# Patient Record
Sex: Male | Born: 1980 | Hispanic: Yes | Marital: Married | Smoking: Never smoker
Health system: Southern US, Community
[De-identification: ages and names within clinical notes are randomized; demographics above are authoritative.]

## PROBLEM LIST (undated history)

## (undated) ENCOUNTER — Emergency Department (HOSPITAL_COMMUNITY): Admission: EM | Payer: 59

## (undated) DIAGNOSIS — I10 Essential (primary) hypertension: Secondary | ICD-10-CM

## (undated) HISTORY — PX: EYE SURGERY: SHX253

---

## 2015-05-23 ENCOUNTER — Emergency Department (INDEPENDENT_AMBULATORY_CARE_PROVIDER_SITE_OTHER)
Admission: EM | Admit: 2015-05-23 | Discharge: 2015-05-23 | Disposition: A | Discharge status: Home / Self Care | Payer: Self-pay | Source: Home / Self Care | Attending: Family Medicine | Admitting: Family Medicine

## 2015-05-23 ENCOUNTER — Encounter (HOSPITAL_COMMUNITY): Payer: Self-pay | Admitting: *Deleted

## 2015-05-23 DIAGNOSIS — A0811 Acute gastroenteropathy due to Norwalk agent: Secondary | ICD-10-CM

## 2015-05-23 MED ORDER — ALIGN 4 MG PO CAPS: 1.0000 | ORAL_CAPSULE | Freq: Two times a day (BID) | ORAL | Status: DC

## 2015-05-23 NOTE — ED Notes (Signed)
Pt    Reports     Vomited     X  2  Days        With  abd  Cramping    With  Symptoms   X  3  Days          family  Members  Have  Similar  Symptoms        Pt  Reports  He  Has  Been  Drinking a  Lot  Of  Fluids  And  Trying to  Rest

## 2015-05-23 NOTE — Discharge Instructions (Signed)
Clear liquid , bland diet tonight as tolerated, advance on tues as improved, use medicine as needed, activia yogurt  return or see your doctor if any problems.

## 2015-05-23 NOTE — ED Provider Notes (Addendum)
CSN: [id number]     Arrival date & time 05/23/15  1319 History   First MD Initiated Contact with Patient 05/23/15 1603     Chief Complaint  Patient presents with  . Nausea   (Consider location/radiation/quality/duration/timing/severity/associated sxs/prior Treatment) Patient is a 35 y.o. male presenting with vomiting. The history is provided by the patient.  Emesis Severity:  Mild Duration:  4 days Quality:  Stomach contents Progression:  Unchanged Chronicity:  New Recent urination:  Normal Context comment:  All family memebers with same.have resolved. Relieved by:  None tried Worsened by:  Nothing tried Ineffective treatments:  None tried Associated symptoms: diarrhea   Associated symptoms: no abdominal pain, no cough and no fever   Risk factors: sick contacts     History reviewed. No pertinent past medical history. History reviewed. No pertinent past surgical history. History reviewed. No pertinent family history. Social History  Substance Use Topics  . Smoking status: None  . Smokeless tobacco: None  . Alcohol Use: No    Review of Systems  Constitutional: Negative.  Negative for fever.  HENT: Negative.   Respiratory: Negative.   Cardiovascular: Negative.   Gastrointestinal: Positive for nausea, vomiting and diarrhea. Negative for abdominal pain and blood in stool.  Genitourinary: Negative.   Musculoskeletal: Negative.   Skin: Negative.   All other systems reviewed and are negative.   Allergies  Review of patient's allergies indicates no known allergies.  Home Medications   Prior to Admission medications   Medication Sig Start Date End Date Taking? Authorizing Provider  Probiotic Product (ALIGN) 4 MG CAPS Take 1 capsule (4 mg total) by mouth 2 (two) times daily at 10 AM and 5 PM. 05/23/15   [name] Furious Chiarelli, MD   Meds Ordered and Administered this Visit  Medications - No data to display  BP 156/93 mmHg  Pulse 85  Temp(Src) 98.2 F (36.8 C) (Oral)  Resp 18   SpO2 97% No data found.   Physical Exam  Constitutional: He is oriented to person, place, and time. He appears well-developed and well-nourished. No distress.  HENT:  Right Ear: External ear normal.  Mouth/Throat: Oropharynx is clear and moist.  Neck: Normal range of motion. Neck supple.  Abdominal: Soft. Bowel sounds are normal. He exhibits no distension and no mass. There is no tenderness. There is no rebound.  Lymphadenopathy:    He has no cervical adenopathy.  Neurological: He is alert and oriented to person, place, and time.  Skin: Skin is warm and dry.  Nursing note and vitals reviewed.   ED Course  Procedures (including critical care time)  Labs Review Labs Reviewed - No data to display  Imaging Review No results found.   Visual Acuity Review  Right Eye Distance:   Left Eye Distance:   Bilateral Distance:    Right Eye Near:   Left Eye Near:    Bilateral Near:         MDM   1. Gastroenteritis due to norovirus        [name] Kreig Parson, MD 05/23/15 1627  [name] Merl Guardino, MD 05/23/15 [phone]

## 2015-09-27 ENCOUNTER — Encounter (HOSPITAL_COMMUNITY): Payer: Self-pay | Admitting: Family Medicine

## 2015-09-27 ENCOUNTER — Ambulatory Visit (HOSPITAL_COMMUNITY)
Admission: EM | Admit: 2015-09-27 | Discharge: 2015-09-27 | Disposition: A | Discharge status: Home / Self Care | Payer: Self-pay | Attending: Family Medicine | Admitting: Family Medicine

## 2015-09-27 DIAGNOSIS — T148 Other injury of unspecified body region: Secondary | ICD-10-CM

## 2015-09-27 DIAGNOSIS — S338XXA Sprain of other parts of lumbar spine and pelvis, initial encounter: Secondary | ICD-10-CM

## 2015-09-27 DIAGNOSIS — M545 Low back pain, unspecified: Secondary | ICD-10-CM

## 2015-09-27 DIAGNOSIS — T148XXA Other injury of unspecified body region, initial encounter: Secondary | ICD-10-CM

## 2015-09-27 DIAGNOSIS — S39012A Strain of muscle, fascia and tendon of lower back, initial encounter: Secondary | ICD-10-CM

## 2015-09-27 MED ORDER — KETOROLAC TROMETHAMINE 60 MG/2ML IM SOLN
INTRAMUSCULAR | Status: AC
  Filled 2015-09-27: qty 2

## 2015-09-27 MED ORDER — KETOROLAC TROMETHAMINE 60 MG/2ML IM SOLN: 60.0000 mg | Freq: Once | INTRAMUSCULAR | Status: DC

## 2015-09-27 MED ORDER — METHOCARBAMOL 500 MG PO TABS: 500.0000 mg | ORAL_TABLET | Freq: Two times a day (BID) | ORAL | Status: DC

## 2015-09-27 MED ORDER — METHYLPREDNISOLONE 4 MG PO TBPK: ORAL_TABLET | ORAL | Status: DC

## 2015-09-27 MED ORDER — TRAMADOL HCL 50 MG PO TABS: 50.0000 mg | ORAL_TABLET | Freq: Four times a day (QID) | ORAL | Status: DC | PRN

## 2015-09-27 NOTE — ED Provider Notes (Signed)
CSN: [id number]     Arrival date & time 09/27/15  1343 History   First MD Initiated Contact with Patient 09/27/15 1424     Chief Complaint  Patient presents with  . 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 Over the past 3 days, significantly worse as he had to increase his work. Pain is located across the mid and lower lumbar on her musculature. He states there is no one single event that caused the pain. He denies blunt trauma, falls or other injury. About 4 days ago he was mowing the yard and lifting the cuttings from the more and carrying them around to dump. The following day is when the pain struck causing the have decreased mobility and greater pain with movement, bending, pulling and other activities. He is having trouble sleeping with the pain. He is taking 600-800 mg of ibuprofen with partial transient relief.   History reviewed. No pertinent past medical history. History reviewed. No pertinent past surgical history. History reviewed. No pertinent family history. Social History  Substance Use Topics  . Smoking status: Never Smoker   . Smokeless tobacco: None  . Alcohol Use: No    Review of Systems  Constitutional: Positive for activity change. Negative for fever and fatigue.  HENT: Negative.   Respiratory: Negative.   Genitourinary: Negative.   Musculoskeletal: Positive for myalgias and back pain. Negative for joint swelling and neck pain.  Skin: Negative.   Neurological: Negative.  Negative for syncope, facial asymmetry, weakness and numbness.  All other systems reviewed and are negative.   Allergies  Review of patient's allergies indicates no known allergies.  Home Medications   Prior to Admission  medications   Medication Sig Start Date End Date Taking? Authorizing Provider  methocarbamol (ROBAXIN) 500 MG tablet Take 1 tablet (500 mg total) by mouth 2 (two) times daily. May cause drowsiness 09/27/15   Hayden Rasmussen, NP  methylPREDNISolone (MEDROL DOSEPAK) 4 MG TBPK tablet follow package directions 09/27/15   Hayden Rasmussen, NP  Probiotic Product (ALIGN) 4 MG CAPS Take 1 capsule (4 mg total) by mouth 2 (two) times daily at 10 AM and 5 PM. 05/23/15   [name], MD  traMADol (ULTRAM) 50 MG tablet Take 1 tablet (50 mg total) by mouth every 6 (six) hours as needed. 09/27/15   Hayden Rasmussen, NP   Meds Ordered and Administered this Visit   Medications  ketorolac (TORADOL) injection 60 mg (not administered)    BP 133/89 mmHg  Pulse 76  Temp(Src) 98.6 F (37 C)  Resp 18  SpO2 99% No data found.   Physical Exam  Constitutional: He is oriented to person, place, and time. He appears well-developed and well-nourished. No distress.  Eyes: EOM are normal.  Neck: Normal range of motion. Neck supple.  Cardiovascular: Normal rate.   Pulmonary/Chest: Effort normal.  Musculoskeletal:  Tenderness across the paralumbar musculature. Mild tenderness over the mid lumbar spine. No palpable deformity, step-off deformity, swelling or discoloration. Palpation reveals a smooth and level on over the spinous process. There are areas over the right and left paraspinous muscles with a knotty texture likely representing muscle spasms. Distal neurovascular motor sensory is intact.  Neurological: He is alert and oriented to person, place, and time. No cranial nerve deficit. He exhibits normal muscle tone.  Skin: Skin is warm and dry.  Psychiatric: He has a normal mood and affect.  Nursing note and vitals reviewed.   ED Course  Procedures (including critical care time)  Labs Review Labs Reviewed - No data to display  Imaging Review No results found.   Visual Acuity Review  Right Eye Distance:   Left Eye  Distance:   Bilateral Distance:    Right Eye Near:   Left Eye Near:    Bilateral Near:         MDM   1. Bilateral low back pain without sciatica   2. Muscle strain   3. Lumbosacral strain, initial encounter    Continue the heat followed by stretches. Minimize as much as possible lifting, bending, pulling and other measures that place stress on the lower back. Meds ordered this encounter  Medications  . ketorolac (TORADOL) injection 60 mg    Sig:   . methylPREDNISolone (MEDROL DOSEPAK) 4 MG TBPK tablet    Sig: follow package directions    Dispense:  21 tablet    Refill:  0    Order Specific Question:  Supervising Provider    Answer:  Linna HoffKINDL, JAMES D [phone][[id number]]  . traMADol (ULTRAM) 50 MG tablet    Sig: Take 1 tablet (50 mg total) by mouth every 6 (six) hours as needed.    Dispense:  15 tablet    Refill:  0    Order Specific Question:  Supervising Provider    Answer:  Linna HoffKINDL, JAMES D [phone][[id number]]  . methocarbamol (ROBAXIN) 500 MG tablet    Sig: Take 1 tablet (500 mg total) by mouth 2 (two) times daily. May cause drowsiness    Dispense:  20 tablet    Refill:  0    Order Specific Question:  Supervising Provider    Answer:  Linna HoffKINDL, JAMES D [[id number]]       Hayden Rasmussenavid Holdyn Poyser, NP 09/27/15 1449

## 2015-09-27 NOTE — Discharge Instructions (Signed)
Back Pain, Adult Continue the heat followed by stretches. Minimize as much as possible lifting, bending, pulling and other measures that place stress on the lower back. Back pain is very common. The pain often gets better over time. The cause of back pain is usually not dangerous. Most people can learn to manage their back pain on their own.  HOME CARE  Watch your back pain for any changes. The following actions may help to lessen any pain you are feeling:  Stay active. Start with short walks on flat ground if you can. Try to walk farther each day.  Exercise regularly as told by your doctor. Exercise helps your back heal faster. It also helps avoid future injury by keeping your muscles strong and flexible.  Do not sit, drive, or stand in one place for more than 30 minutes.  Do not stay in bed. Resting more than 1-2 days can slow down your recovery.  Be careful when you bend or lift an object. Use good form when lifting:  Bend at your knees.  Keep the object close to your body.  Do not twist.  Sleep on a firm mattress. Lie on your side, and bend your knees. If you lie on your back, put a pillow under your knees.  Take medicines only as told by your doctor.  Put ice on the injured area.  Put ice in a plastic bag.  Place a towel between your skin and the bag.  Leave the ice on for 20 minutes, 2-3 times a day for the first 2-3 days. After that, you can switch between ice and heat packs.  Avoid feeling anxious or stressed. Find good ways to deal with stress, such as exercise.  Maintain a healthy weight. Extra weight puts stress on your back. GET HELP IF:   You have pain that does not go away with rest or medicine.  You have worsening pain that goes down into your legs or buttocks.  You have pain that does not get better in one week.  You have pain at night.  You lose weight.  You have a fever or chills. GET HELP RIGHT AWAY IF:   You cannot control when you poop (bowel  movement) or pee (urinate).  Your arms or legs feel weak.  Your arms or legs lose feeling (numbness).  You feel sick to your stomach (nauseous) or throw up (vomit).  You have belly (abdominal) pain.  You feel like you may pass out (faint).   This information is not intended to replace advice given to you by your health care provider. Make sure you discuss any questions you have with your health care provider.   Document Released: 08/22/2007 Document Revised: 03/26/2014 Document Reviewed: 07/07/2013 Elsevier Interactive Patient Education 2016 Elsevier Inc.  [employer] Therapy Heat therapy can help ease sore, stiff, injured, and tight muscles and joints. Heat relaxes your muscles, which may help ease your pain.  RISKS AND COMPLICATIONS If you have any of the following conditions, do not use heat therapy unless your health care provider has approved:  Poor circulation.  Healing wounds or scarred skin in the area being treated.  Diabetes, heart disease, or high blood pressure.  Not being able to feel (numbness) the area being treated.  Unusual swelling of the area being treated.  Active infections.  Blood clots.  Cancer.  Inability to communicate pain. This may include young children and people who have problems with their brain function (dementia).  Pregnancy. Heat therapy should only  be used on old, pre-existing, or long-lasting (chronic) injuries. Do not use heat therapy on new injuries unless directed by your health care provider. HOW TO USE HEAT THERAPY There are several different kinds of heat therapy, including:  Moist heat pack.  Warm water bath.  Hot water bottle.  Electric heating pad.  Heated gel pack.  Heated wrap.  Electric heating pad. Use the heat therapy method suggested by your health care provider. Follow your health care provider's instructions on when and how to use heat therapy. GENERAL HEAT THERAPY RECOMMENDATIONS  Do not sleep while using  heat therapy. Only use heat therapy while you are awake.  Your skin may turn pink while using heat therapy. Do not use heat therapy if your skin turns red.  Do not use heat therapy if you have new pain.  High heat or long exposure to heat can cause burns. Be careful when using heat therapy to avoid burning your skin.  Do not use heat therapy on areas of your skin that are already irritated, such as with a rash or sunburn. SEEK MEDICAL CARE IF:  You have blisters, redness, swelling, or numbness.  You have new pain.  Your pain is worse. MAKE SURE YOU:  Understand these instructions.  Will watch your condition.  Will get help right away if you are not doing well or get worse.   This information is not intended to replace advice given to you by your health care provider. Make sure you discuss any questions you have with your health care provider.   Document Released: 05/28/2011 Document Revised: 03/26/2014 Document Reviewed: 04/28/2013 Elsevier Interactive Patient Education [employer] Elsevier Inc.

## 2015-09-27 NOTE — ED Notes (Signed)
Pt here for lower back pain more on the right side. sts just over use. Denies any specific injury.

## 2015-10-06 ENCOUNTER — Encounter (HOSPITAL_COMMUNITY): Payer: Self-pay | Admitting: Emergency Medicine

## 2015-10-06 ENCOUNTER — Ambulatory Visit (HOSPITAL_COMMUNITY)
Admission: EM | Admit: 2015-10-06 | Discharge: 2015-10-06 | Disposition: A | Discharge status: Home / Self Care | Payer: Self-pay | Attending: Emergency Medicine | Admitting: Emergency Medicine

## 2015-10-06 ENCOUNTER — Ambulatory Visit (INDEPENDENT_AMBULATORY_CARE_PROVIDER_SITE_OTHER): Payer: Self-pay

## 2015-10-06 DIAGNOSIS — M79672 Pain in left foot: Secondary | ICD-10-CM

## 2015-10-06 DIAGNOSIS — M7742 Metatarsalgia, left foot: Secondary | ICD-10-CM

## 2015-10-06 NOTE — ED Provider Notes (Signed)
CSN: [id number]     Arrival date & time 10/06/15  1416 History   First MD Initiated Contact with Patient 10/06/15 1503     Chief Complaint  Patient presents with  . 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 He denies any known injury or any specific time that the pain started it was gradual in onset. Other symptoms include: Resolving back pain Pain score: 7 at its worst FAMILY HISTORY: Hypertension     History reviewed. No pertinent past medical history. History reviewed. No pertinent past surgical history. History reviewed. No pertinent family history. Social History  Substance Use Topics  . Smoking status: Never Smoker   . Smokeless tobacco: None  . Alcohol Use: Yes     Comment: maybe twice a year    Review of Systems  Denies: HEADACHE, NAUSEA, ABDOMINAL PAIN, CHEST PAIN, CONGESTION, DYSURIA, SHORTNESS OF BREATH  Allergies  Review of patient's allergies indicates no known allergies.  Home Medications   Prior to Admission medications   Medication Sig Start Date End Date Taking? Authorizing Provider  ibuprofen (ADVIL,MOTRIN) 200 MG tablet Take 400 mg by mouth every 6 (six) hours as needed for moderate pain.   Yes Historical Provider, MD  methocarbamol (ROBAXIN) 500 MG tablet Take 1 tablet (500 mg total) by mouth 2 (two) times daily. May cause drowsiness 09/27/15   Hayden Rasmussen, NP  methylPREDNISolone (MEDROL DOSEPAK) 4 MG TBPK tablet follow package directions 09/27/15   Hayden Rasmussen, NP  Probiotic Product (ALIGN) 4 MG CAPS Take 1 capsule (4 mg total) by mouth 2 (two) times daily at 10 AM and 5 PM. 05/23/15   [name], MD   traMADol (ULTRAM) 50 MG tablet Take 1 tablet (50 mg total) by mouth every 6 (six) hours as needed. 09/27/15   Hayden Rasmussen, NP   Meds Ordered and Administered this Visit  Medications - No data to display  BP 134/61 mmHg  Pulse 74  Temp(Src) 98.8 F (37.1 C) (Oral)  Resp 16  SpO2 99% No data found.   Physical Exam NURSES NOTES AND VITAL SIGNS REVIEWED. CONSTITUTIONAL: Well developed, well nourished, no acute distress HEENT: normocephalic, atraumatic EYES: Conjunctiva normal NECK:normal ROM, supple, no adenopathy PULMONARY:No respiratory distress, normal effort ABDOMINAL: Soft, ND, NT BS+, No CVAT MUSCULOSKELETAL: Normal ROM of all extremities, left foot is tender along the lateral aspect of the fifth metatarsal. There is no visible or palpable deformity. Sensorimotor function are intact. Patient does weight-bear with slight limp SKIN: warm and dry without rash PSYCHIATRIC: Mood and affect, behavior are normal  ED Course  Procedures (including critical care time)  Labs Review Labs Reviewed - No data to display  Imaging Review Dg Foot Complete Left  10/06/2015  CLINICAL DATA:  Progressive pain for 3 days EXAM: LEFT FOOT - COMPLETE 3+ VIEW COMPARISON:  None. FINDINGS: Frontal, oblique, and lateral views were obtained. There is no fracture or dislocation. The joint spaces appear normal. No erosive change. IMPRESSION: No fracture or dislocation.  No apparent arthropathy. Electronically Signed   By: Bretta Bang III M.D.   On: 10/06/2015 15:44     Visual Acuity Review  Right Eye Distance:  Left Eye Distance:   Bilateral Distance:    Right Eye Near:   Left Eye Near:    Bilateral Near:      We'll try a cam walker to see if this improves his symptoms but unsure that he can wear this to work.  Return to work note provided. MDM   1. Foot pain, left     Patient is reassured that there are no issues that require transfer to higher level of care at this time or  additional tests. Patient is advised to continue home symptomatic treatment. Patient is advised that if there are new or worsening symptoms to attend the emergency department, contact primary care provider, or return to UC. Instructions of care provided discharged home in stable condition.    THIS NOTE WAS GENERATED USING A VOICE RECOGNITION SOFTWARE PROGRAM. ALL REASONABLE EFFORTS  WERE MADE TO PROOFREAD THIS DOCUMENT FOR ACCURACY.  I have verbally reviewed the discharge instructions with the patient. A printed AVS was given to the patient.  All questions were answered prior to discharge.      Tharon AquasFrank C Onedia Vargus, [place] 10/06/15 [phone]

## 2015-10-06 NOTE — Discharge Instructions (Signed)
Musculoskeletal Pain Musculoskeletal pain is muscle and boney aches and pains. These pains can occur in any part of the body. Your caregiver may treat you without knowing the cause of the pain. They may treat you if blood or urine tests, X-rays, and other tests were normal.  CAUSES There is often not a definite cause or reason for these pains. These pains may be caused by a type of germ (virus). The discomfort may also come from overuse. Overuse includes working out too hard when your body is not fit. Boney aches also come from weather changes. Bone is sensitive to atmospheric pressure changes. HOME CARE INSTRUCTIONS   Ask when your test results will be ready. Make sure you get your test results.  Only take over-the-counter or prescription medicines for pain, discomfort, or fever as directed by your caregiver. If you were given medications for your condition, do not drive, operate machinery or power tools, or sign legal documents for 24 hours. Do not drink alcohol. Do not take sleeping pills or other medications that may interfere with treatment.  Continue all activities unless the activities cause more pain. When the pain lessens, slowly resume normal activities. Gradually increase the intensity and duration of the activities or exercise.  During periods of severe pain, bed rest may be helpful. Lay or sit in any position that is comfortable.  Putting ice on the injured area.  Put ice in a bag.  Place a towel between your skin and the bag.  Leave the ice on for 15 to 20 minutes, 3 to 4 times a day.  Follow up with your caregiver for continued problems and no reason can be found for the pain. If the pain becomes worse or does not go away, it may be necessary to repeat tests or do additional testing. Your caregiver may need to look further for a possible cause. SEEK IMMEDIATE MEDICAL CARE IF:  You have pain that is getting worse and is not relieved by medications.  You develop chest pain  that is associated with shortness or breath, sweating, feeling sick to your stomach (nauseous), or throw up (vomit).  Your pain becomes localized to the abdomen.  You develop any new symptoms that seem different or that concern you. MAKE SURE YOU:   Understand these instructions.  Will watch your condition.  Will get help right away if you are not doing well or get worse.   This information is not intended to replace advice given to you by your health care provider. Make sure you discuss any questions you have with your health care provider.   Document Released: 03/05/2005 Document Revised: 05/28/2011 Document Reviewed: 11/07/2012 Elsevier Interactive Patient Education 2016 [employer] therapy can help ease sore, stiff, injured, and tight muscles and joints. Heat relaxes your muscles, which may help ease your pain. Heat therapy should only be used on old, pre-existing, or long-lasting (chronic) injuries. Do not use heat therapy unless told by your doctor. HOW TO USE HEAT THERAPY There are several different kinds of heat therapy, including:  Moist heat pack.  Warm water bath.  Hot water bottle.  Electric heating pad.  Heated gel pack.  Heated wrap.  Electric heating pad. GENERAL HEAT THERAPY RECOMMENDATIONS   Do not sleep while using heat therapy. Only use heat therapy while you are awake.  Your skin may turn pink while using heat therapy. Do not use heat therapy if your skin turns red.  Do not use heat therapy if  you have new pain.  High heat or long exposure to heat can cause burns. Be careful when using heat therapy to avoid burning your skin.  Do not use heat therapy on areas of your skin that are already irritated, such as with a rash or sunburn. GET HELP IF:   You have blisters, redness, swelling (puffiness), or numbness.  You have new pain.  Your pain is worse. MAKE SURE YOU:  Understand these instructions.  Will watch your  condition.  Will get help right away if you are not doing well or get worse.   This information is not intended to replace advice given to you by your health care provider. Make sure you discuss any questions you have with your health care provider.   Document Released: 05/28/2011 Document Revised: 03/26/2014 Document Reviewed: 04/28/2013 Elsevier Interactive Patient Education [employer] Elsevier Inc[employer]

## 2015-10-06 NOTE — ED Notes (Signed)
Pt is suffering from lateral left foot pain.  He states it started while finishing up a day at work.  He had two days off, but the pain has not subsided.  He reports a 1-2/10 without ambulation and 4-7/10 with ambulation.  Pt denies any specific injury to the foot.

## 2018-05-25 ENCOUNTER — Other Ambulatory Visit: Payer: Self-pay

## 2018-05-25 ENCOUNTER — Emergency Department (HOSPITAL_COMMUNITY)
Admission: EM | Admit: 2018-05-25 | Discharge: 2018-05-25 | Disposition: A | Discharge status: Home / Self Care | Payer: Self-pay | Attending: Emergency Medicine | Admitting: Emergency Medicine

## 2018-05-25 ENCOUNTER — Encounter (HOSPITAL_COMMUNITY): Payer: Self-pay | Admitting: Emergency Medicine

## 2018-05-25 ENCOUNTER — Emergency Department (HOSPITAL_COMMUNITY): Payer: Self-pay

## 2018-05-25 DIAGNOSIS — J101 Influenza due to other identified influenza virus with other respiratory manifestations: Secondary | ICD-10-CM | POA: Insufficient documentation

## 2018-05-25 DIAGNOSIS — Z79899 Other long term (current) drug therapy: Secondary | ICD-10-CM | POA: Insufficient documentation

## 2018-05-25 LAB — GROUP A STREP BY PCR: GROUP A STREP BY PCR: NOT DETECTED

## 2018-05-25 LAB — INFLUENZA PANEL BY PCR (TYPE A & B)
INFLAPCR: POSITIVE — AB
Influenza B By PCR: NEGATIVE

## 2018-05-25 MED ORDER — ACETAMINOPHEN 325 MG PO TABS
650.0000 mg | ORAL_TABLET | Freq: Once | ORAL | Status: AC
  Administered 2018-05-25: 650 mg via ORAL
  Filled 2018-05-25: qty 2

## 2018-05-25 NOTE — ED Notes (Signed)
Pt. To x-ray .

## 2018-05-25 NOTE — Discharge Instructions (Signed)
Evaluated today for symptoms.  Your flu test was positive.  Continue to take Tylenol and ibuprofen for fever and body aches and pains.  Try to continue drinking fluids daily not become dehydrated.  Follow up with PCP if you continue to have symptoms beyond 5 days.  I would suggest calling the NICU to determine visiting precautions as your flu positive and premature infants are more susceptible to complications from the flu.  Return to the ED for any worsening symptoms.

## 2018-05-25 NOTE — ED Provider Notes (Signed)
Care assumed at shift change from Ward, PA-C.  See note for detailed HPI.  In summation 38 year old male, who presents for evaluation of flu-like symptoms. Onset 3 days ago. Temp max at home 102.6 yesterday evening.  Has been taking Tylenol and ibuprofen for symptoms.  Patient has Hunt 61-week-old premature infant currently in NICU.  Patient states he would like to know if he is Flu+ as he may have different visiting guidelines if he is positive.  Care transfer patient with negative strep and negative chest x-ray.  Influenza testing pending.  Influenza Hunt positive. Patient understands that he is outside treatment window for Tamiflu. Vitals are stable, low-grade fever.  No signs of dehydration, tolerating PO's.  Lungs are clear bilateral without wheeze, rhonchi or rales.  No tachypnea, tachycardia or hypoxia. Patient will be discharged with instructions to orally hydrate, rest, and use over-the-counter medications such as anti-inflammatories ibuprofen and Aleve for muscle aches and Tylenol for fever.  Patient hemodynamically stable and appropriate for discharge at this time.  Discussed return precautions with patient.  Patient voiced understanding and is agreeable for follow up.   1. Influenza Hunt   Antonio Madariaga A, PA-C 05/25/18 1641    Linwood Dibbles, MD 05/27/18 [phone]

## 2018-05-25 NOTE — ED Provider Notes (Signed)
[facility] EMERGENCY DEPARTMENT Provider Note   CSN: [id number] Arrival date & time: 05/25/18  1302    History   Chief Complaint Chief Complaint  Patient presents with  . URI    HPI Antonio Hunt is a 38 y.o. male.     The history is provided by the patient and medical records. No language interpreter was used.  URI  Presenting symptoms: congestion, cough, ear pain, fever and sore throat   Associated symptoms: myalgias   Associated symptoms: no headaches and no neck pain    Antonio Hunt is a 38 y.o. male  with no known PMH who presents to the Emergency Department complaining of cough, congestion x 3 days.  He reports temperature at home of 102.6 last night.  He took ibuprofen and Tylenol, alternating between the 2.  This morning, he took DayQuil instead of Tylenol.  He also has been using cough drops.  Associated with chills, sweats, rhinorrhea, fatigue and mild generalized body aches.  Denies any chest pain or trouble breathing.  No abdominal pain, nausea or vomiting.  Patient reports that he currently has a newborn infant that was born premature in the NICU. He would like to know if he has the flu, stating that the NICU has strict visiting guidelines and timeframes if you have tested positive.   History reviewed. No pertinent past medical history.  There are no active problems to display for this patient.   Past Surgical History:  Procedure Laterality Date  . EYE SURGERY          Home Medications    Prior to Admission medications   Medication Sig Start Date End Date Taking? Authorizing Provider  ibuprofen (ADVIL,MOTRIN) 200 MG tablet Take 400 mg by mouth every 6 (six) hours as needed for moderate pain.    [provider]  methocarbamol (ROBAXIN) 500 MG tablet Take 1 tablet (500 mg total) by mouth 2 (two) times daily. May cause drowsiness 09/27/15   Hayden Rasmussen, NP  methylPREDNISolone (MEDROL DOSEPAK) 4 MG TBPK tablet follow package  directions 09/27/15   Hayden Rasmussen, NP  Probiotic Product (ALIGN) 4 MG CAPS Take 1 capsule (4 mg total) by mouth 2 (two) times daily at 10 AM and 5 PM. 05/23/15   Kindl, Quita Skye, MD  traMADol (ULTRAM) 50 MG tablet Take 1 tablet (50 mg total) by mouth every 6 (six) hours as needed. 09/27/15   Hayden Rasmussen, NP    Family History History reviewed. No pertinent family history.  Social History Social History   Tobacco Use  . Smoking status: Never Smoker  . Smokeless tobacco: Never Used  Substance Use Topics  . Alcohol use: Yes    Comment: maybe twice a year  . Drug use: No     Allergies   Patient has no known allergies.   Review of Systems Review of Systems  Constitutional: Positive for chills and fever.  HENT: Positive for congestion, ear pain and sore throat.   Eyes: Negative for discharge.  Respiratory: Positive for cough. Negative for shortness of breath.   Cardiovascular: Negative for chest pain.  Gastrointestinal: Negative for abdominal pain, diarrhea, nausea and vomiting.  Musculoskeletal: Positive for myalgias. Negative for neck pain.  Skin: Negative for rash.  Allergic/Immunologic: Negative for immunocompromised state.  Neurological: Negative for headaches.  All other systems reviewed and are negative.    Physical Exam Updated Vital Signs BP (!) 153/89 (BP Location: Right Arm)   Pulse 100   Temp 100 F (  37.8 C) (Oral)   Resp 18   Ht 5\' 6"  (1.676 m)   Wt 119.3 kg   SpO2 94%   BMI 42.45 kg/m   Physical Exam Vitals signs and nursing note reviewed.  Constitutional:      General: He is not in acute distress.    Appearance: He is well-developed.  HENT:     Head: Normocephalic and atraumatic.     Ears:     Comments: Bilateral TMs normal.  No mastoid tenderness.    Nose: Congestion present.     Mouth/Throat:     Comments: Oropharynx with erythema, no exudates or tonsillar hypertrophy appreciated. Neck:     Musculoskeletal: Neck supple.  Cardiovascular:      Rate and Rhythm: Normal rate and regular rhythm.     Heart sounds: Normal heart sounds. No murmur.  Pulmonary:     Effort: Pulmonary effort is normal. No respiratory distress.     Breath sounds: Normal breath sounds.     Comments: Lungs are clear to ausculation bilaterally. Abdominal:     General: There is no distension.     Palpations: Abdomen is soft.     Tenderness: There is no abdominal tenderness.  Skin:    General: Skin is warm and dry.  Neurological:     Mental Status: He is alert and oriented to person, place, and time.      ED Treatments / Results  Labs (all labs ordered are listed, but only abnormal results are displayed) Labs Reviewed  GROUP A STREP BY PCR  INFLUENZA PANEL BY PCR (TYPE A & B)    EKG None  Radiology Dg Chest 2 View  Result Date: 05/25/2018 CLINICAL DATA:  Upper respiratory infection EXAM: CHEST - 2 VIEW COMPARISON:  None. FINDINGS: Normal heart size and mediastinal contours. No acute infiltrate or edema. No effusion or pneumothorax. No acute osseous findings. IMPRESSION: Negative chest. Electronically Signed   By: Marnee Spring M.D.   On: 05/25/2018 14:51    Procedures Procedures (including critical care time)  Medications Ordered in ED Medications - No data to display   Initial Impression / Assessment and Plan / ED Course  I have reviewed the triage vital signs and the nursing notes.  Pertinent labs & imaging results that were available during my care of the patient were reviewed by me and considered in my medical decision making (see chart for details).       Antonio Hunt is a 38 y.o. male who presents to ED for fever, cough, congestion which has been getting progressively worse over the last 3 days.  Temp of 102.6 at home.  He took antipyretics with temperature of 100 upon ER arrival.  His lungs are clear to auscultation bilaterally.  Chest x-ray without acute disease - no pneumonia. Patient does have a preemie in the NICU and  requesting flu test.  Feel this is reasonable. Flu swab sent and pending at shift change. Strep PCR pending as well. Care assumed by oncoming provider PA Henderly. Case discussed, plan agreed upon. Will follow up on pending strep/flu swabs. He is out of the window for Tamiflu. Anticipate symptomatic home care with close PCP follow up.    Final Clinical Impressions(s) / ED Diagnoses   Final diagnoses:  None    ED Discharge Orders    None       , Chase Picket, PA-C 05/25/18 1545    Linwood Dibbles, MD 05/27/18 [phone]

## 2018-05-25 NOTE — ED Triage Notes (Signed)
The patient's wife has been having URI symptoms and now he has been coughing, sneezing, sweating, runny nose, pain in ears and fatigue.  The cough is productive, dark yellow so he has been taking dayquil, nightquil, cough drops and tylenol and ibuprofen.  He has had a fever of 102.6.  His fever has gone down to 100.  Denies any other symptoms.

## 2019-12-04 ENCOUNTER — Other Ambulatory Visit: Payer: Self-pay

## 2019-12-04 DIAGNOSIS — Z20822 Contact with and (suspected) exposure to covid-19: Secondary | ICD-10-CM

## 2019-12-07 LAB — NOVEL CORONAVIRUS, NAA: SARS-CoV-2, NAA: NOT DETECTED

## 2020-02-10 ENCOUNTER — Other Ambulatory Visit: Payer: Self-pay

## 2020-02-10 DIAGNOSIS — Z20822 Contact with and (suspected) exposure to covid-19: Secondary | ICD-10-CM

## 2020-02-11 LAB — NOVEL CORONAVIRUS, NAA: SARS-CoV-2, NAA: NOT DETECTED

## 2020-02-11 LAB — SARS-COV-2, NAA 2 DAY TAT

## 2020-07-24 IMAGING — DX DG CHEST 2V
2 series · 2 of 2 positions shown · non-contrast
Comparison: None.

CLINICAL DATA: Upper respiratory infection

EXAM:
CHEST - 2 VIEW

[chest pa]
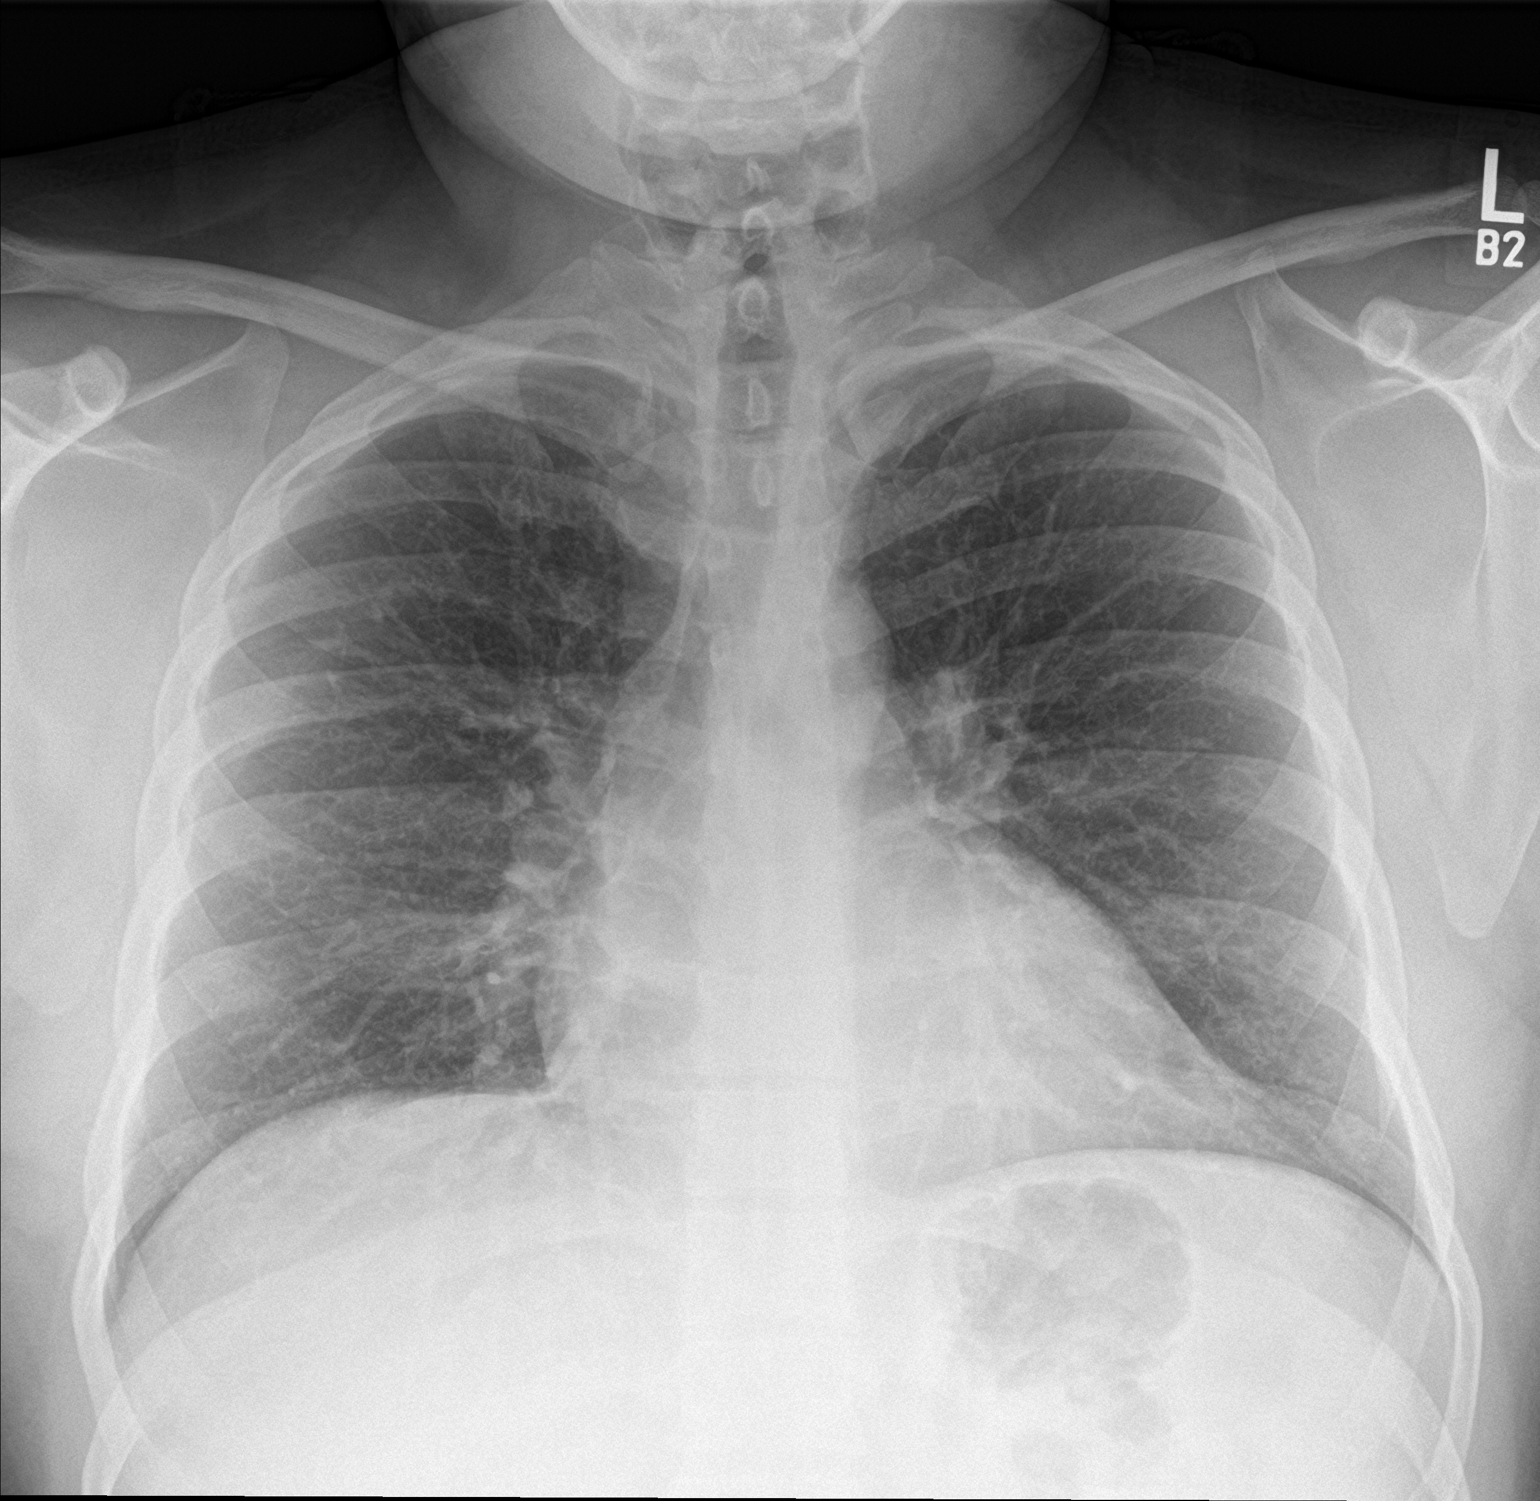

[chest lat]
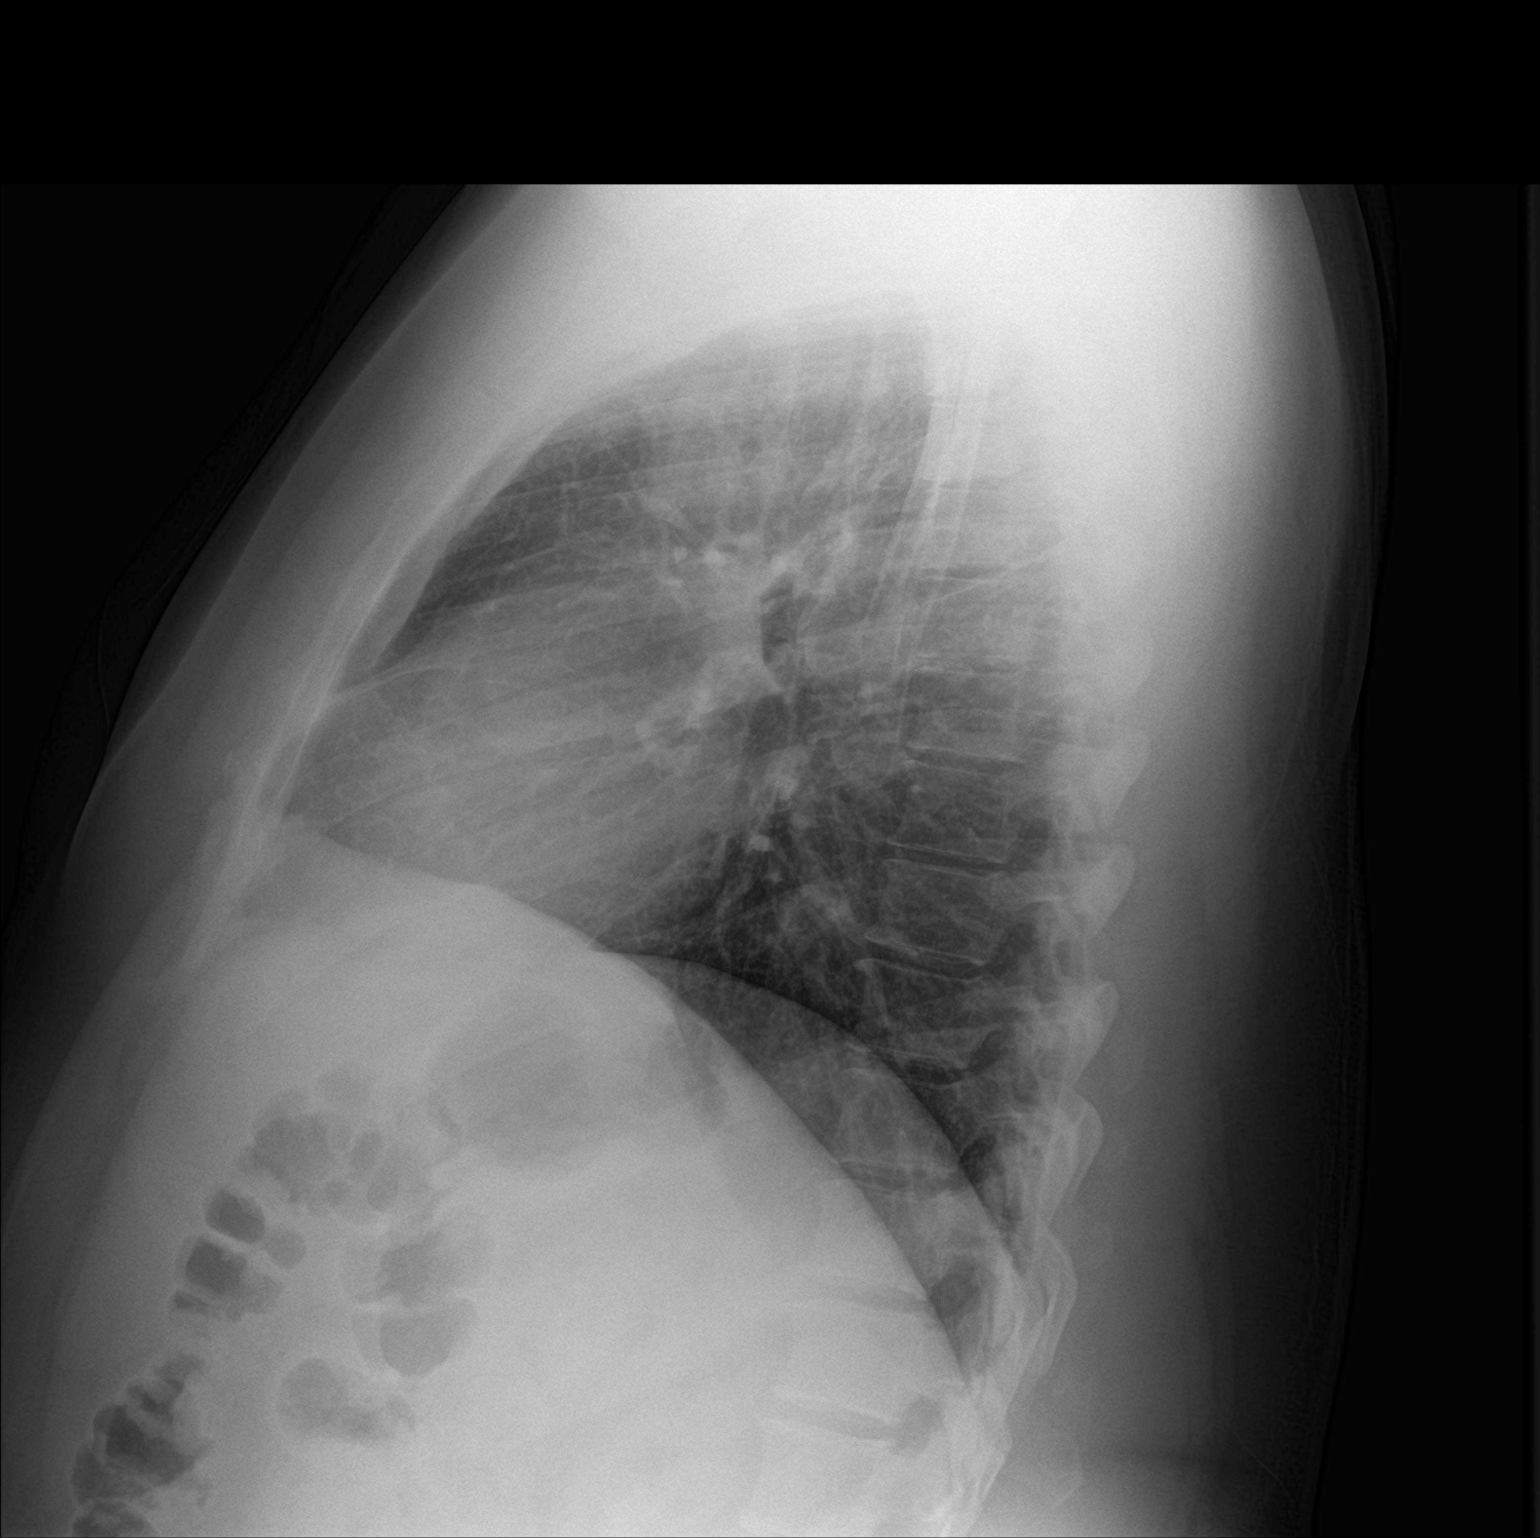

[2 of 2 positions shown; findings below may reference images not displayed]

FINDINGS: Normal heart size and mediastinal contours. No acute infiltrate or
edema. No effusion or pneumothorax. No acute osseous findings.
IMPRESSION: Negative chest.

## 2021-06-26 ENCOUNTER — Emergency Department (HOSPITAL_COMMUNITY)
Admission: EM | Admit: 2021-06-26 | Discharge: 2021-06-26 | Disposition: A | Discharge status: Home / Self Care | Payer: 59 | Attending: Emergency Medicine | Admitting: Emergency Medicine

## 2021-06-26 ENCOUNTER — Encounter (HOSPITAL_COMMUNITY): Payer: Self-pay | Admitting: Emergency Medicine

## 2021-06-26 ENCOUNTER — Emergency Department (HOSPITAL_COMMUNITY): Payer: 59

## 2021-06-26 ENCOUNTER — Other Ambulatory Visit: Payer: Self-pay

## 2021-06-26 DIAGNOSIS — I10 Essential (primary) hypertension: Secondary | ICD-10-CM | POA: Insufficient documentation

## 2021-06-26 DIAGNOSIS — R070 Pain in throat: Secondary | ICD-10-CM | POA: Insufficient documentation

## 2021-06-26 DIAGNOSIS — R0789 Other chest pain: Secondary | ICD-10-CM | POA: Insufficient documentation

## 2021-06-26 HISTORY — DX: Essential (primary) hypertension: I10

## 2021-06-26 LAB — BASIC METABOLIC PANEL
Anion gap: 8 (ref 5–15)
BUN: 9 mg/dL (ref 6–20)
CO2: 25 mmol/L (ref 22–32)
Calcium: 9.2 mg/dL (ref 8.9–10.3)
Chloride: 104 mmol/L (ref 98–111)
Creatinine, Ser: 0.8 mg/dL (ref 0.61–1.24)
GFR, Estimated: >60 mL/min (ref >60)
Glucose, Bld: 131 mg/dL — ABNORMAL HIGH (ref 70–99)
Potassium: 4.1 mmol/L (ref 3.5–5.1)
Sodium: 137 mmol/L (ref 135–145)

## 2021-06-26 LAB — CBC
HCT: 44.6 % (ref 39.0–52.0)
Hemoglobin: 14.7 g/dL (ref 13.0–17.0)
MCH: 30.1 pg (ref 26.0–34.0)
MCHC: 33 g/dL (ref 30.0–36.0)
MCV: 91.4 fL (ref 80.0–100.0)
Platelets: 281 10*3/uL (ref 150–400)
RBC: 4.88 MIL/uL (ref 4.22–5.81)
RDW: 13.5 % (ref 11.5–15.5)
WBC: 10.7 10*3/uL — ABNORMAL HIGH (ref 4.0–10.5)
nRBC: 0 % (ref 0.0–0.2)

## 2021-06-26 LAB — TROPONIN I (HIGH SENSITIVITY)
Troponin I (High Sensitivity): 8 ng/L (ref <18)
Troponin I (High Sensitivity): 4 ng/L (ref <18)

## 2021-06-26 MED ORDER — LIDOCAINE VISCOUS HCL 2 % MT SOLN
15.0000 mL | Freq: Once | OROMUCOSAL | Status: AC
  Administered 2021-06-26: 15 mL via ORAL
  Filled 2021-06-26: qty 15

## 2021-06-26 MED ORDER — ALUM & MAG HYDROXIDE-SIMETH 200-200-20 MG/5ML PO SUSP
30.0000 mL | Freq: Once | ORAL | Status: AC
  Administered 2021-06-26: 30 mL via ORAL
  Filled 2021-06-26: qty 30

## 2021-06-26 NOTE — ED Triage Notes (Signed)
Pt c/o chest pain and hypertension since last night ?

## 2021-06-26 NOTE — ED Provider Notes (Signed)
?[facility] EMERGENCY DEPARTMENT ?Provider Note ? ? ?CSN: [id number] ?Arrival date & time: 06/26/21  0101 ? ?  ? ?History ? ?Chief Complaint  ?Patient presents with  ? Chest Pain  ? ? ?Kenry Daubert is a 41 y.o. male. ? ?The history is provided by the patient. No language interpreter was used.  ?Chest Pain ? ?41 year old morbidly obese male significant history of hypertension, presenting to the ED for evaluation of chest pain.  Patient report he woke up this morning with a discomfort sensation in his mid upper chest towards his neck.  Felt some acidic taste in his mouth, and some burning sensation in his neck.  Symptom has been waxing waning, he improves with eating a banana, and taking some Prilosec.  He did not report of any associated fever chills runny nose sneezing nausea vomiting diarrhea constipation lightheadedness dizziness or diaphoresis.  No significant family history of premature cardiac death.  Patient denies alcohol or tobacco abuse.  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  Does not complain of any leg swelling or calf pain.  Symptoms mild at this time.  Patient report eating salty food last night which felt may contribute to his symptoms ? ?Home Medications ?Prior to Admission medications   ?Medication Sig Start Date End Date Taking? Authorizing Provider  ?ibuprofen (ADVIL,MOTRIN) 200 MG tablet Take 400 mg by mouth every 6 (six) hours as needed for moderate pain.    [provider]  ?methocarbamol (ROBAXIN) 500 MG tablet Take 1 tablet (500 mg total) by mouth 2 (two) times daily. May cause drowsiness 09/27/15   Hayden Rasmussen, NP  ?methylPREDNISolone (MEDROL DOSEPAK) 4 MG TBPK tablet follow package directions 09/27/15   Hayden Rasmussen, NP  ?Probiotic Product (ALIGN) 4 MG CAPS Take 1 capsule (4 mg total) by mouth 2 (two) times daily at 10 AM and 5 PM. 05/23/15   Kindl, Quita Skye, MD   ?traMADol (ULTRAM) 50 MG tablet Take 1 tablet (50 mg total) by mouth every 6 (six) hours as needed. 09/27/15   Hayden Rasmussen, NP  ?   ? ?Allergies    ?Patient has no known allergies.   ? ?Review of Systems   ?Review of Systems  ?Cardiovascular:  Positive for chest pain.  ?All other systems reviewed and are negative. ? ?Physical Exam ?Updated Vital Signs ?BP (!) 165/108 (BP Location: Right Arm)   Pulse 99   Temp 98.7 ?F (37.1 ?C) (Oral)   Resp 18   Ht 5\' 6"  (1.676 m)   Wt (!) 139.7 kg   SpO2 98%   BMI 49.71 kg/m?  ?Physical Exam ?Vitals and nursing note reviewed.  ?Constitutional:   ?   General: He is not in acute distress. ?   Appearance: He is well-developed. He is obese.  ?HENT:  ?   Head: Atraumatic.  ?Eyes:  ?   Conjunctiva/sclera: Conjunctivae normal.  ?Cardiovascular:  ?   Rate and Rhythm: Normal rate and regular rhythm.  ?   Pulses: Normal pulses.  ?   Heart sounds: Normal heart sounds.  ?Pulmonary:  ?   Effort: Pulmonary effort is normal.  ?   Breath sounds: Normal breath sounds. No wheezing, rhonchi or rales.  ?Chest:  ?   Chest wall: No tenderness.  ?Abdominal:  ?   Palpations: Abdomen is soft.  ?   Tenderness: There is no abdominal tenderness.  ?  Musculoskeletal:  ?   Cervical back: Neck supple.  ?   Right lower leg: No edema.  ?   Left lower leg: No edema.  ?Skin: ?   Findings: No rash.  ?Neurological:  ?   Mental Status: He is alert.  ? ? ?ED Results / Procedures / Treatments   ?Labs ?(all labs ordered are listed, but only abnormal results are displayed) ?Labs Reviewed  ?BASIC METABOLIC PANEL - Abnormal; Notable for the following components:  ?    Result Value  ? Glucose, Bld 131 (*)   ? All other components within normal limits  ?CBC - Abnormal; Notable for the following components:  ? WBC 10.7 (*)   ? All other components within normal limits  ?TROPONIN I (HIGH SENSITIVITY)  ?TROPONIN I (HIGH SENSITIVITY)  ? ? ?EKG ?None ? Date: 06/26/2021 ? Rate: 99 ? Rhythm: normal sinus rhythm ? QRS Axis:  normal ? Intervals: normal ? ST/T Wave abnormalities: normal ? Conduction Disutrbances: none ? Narrative Interpretation:  ? Old EKG Reviewed: No significant changes noted ? ? ? ?Radiology ?DG Chest 2 View ? ?Result Date: 06/26/2021 ?CLINICAL DATA:  Chest pain. EXAM: CHEST - 2 VIEW COMPARISON:  May 25, 2018 FINDINGS: The heart size and mediastinal contours are within normal limits. Both lungs are clear. The visualized skeletal structures are unremarkable. IMPRESSION: No active cardiopulmonary disease. Electronically Signed   By: Aram Candelahaddeus  Houston M.D.   On: 06/26/2021 01:34   ? ?Procedures ?Procedures  ? ? ?Medications Ordered in ED ?Medications  ?alum & mag hydroxide-simeth (MAALOX/MYLANTA) 200-200-20 MG/5ML suspension 30 mL (30 mLs Oral Given 06/26/21 0141)  ?  And  ?lidocaine (XYLOCAINE) 2 % viscous mouth solution 15 mL (15 mLs Oral Given 06/26/21 0141)  ? ? ?ED Course/ Medical Decision Making/ A&P ?  ?                        ?Medical Decision Making ?Amount and/or Complexity of Data Reviewed ?Labs: ordered. ?Radiology: ordered. ? ?Risk ?OTC drugs. ?Prescription drug management. ? ? ?BP (!) 165/108 (BP Location: Right Arm)   Pulse 99   Temp 98.7 ?F (37.1 ?C) (Oral)   Resp 18   Ht 5\' 6"  (1.676 m)   Wt (!) 139.7 kg   SpO2 98%   BMI 49.71 kg/m?  ? ?1:43 AM ?This is an obese male with history of hypertension presenting with complaints of chest pain that started early this morning.  Pain is atypical of ACS.  Pain is minimal at this time.  There is some component to suggest this could be acid reflux.  Patient also noted to have elevated blood pressure of 165/108.  He is not on any blood pressure medication.  Heart rate is elevated but patient attributes to "whitecoat syndrome".  He is PERC negative, low suspicion for PE.  GI cocktail given.  Work-up initiated. ? ?3:46 AM ?Labs, EKG, and imaging independently viewed and interpreted by me.  EKG without acute ischemic changes, negative delta troponin, low suspicion  for ACS.  Patient's hear score is too low risk for Mace.  Electrolyte panels are reassuring, normal WBC, normal H&H, chest x-ray without active disease.  Patient report feeling better after receiving GI cocktail.  At this time he is stable to be discharged ? ? ? ? ? ? ? ?Final Clinical Impression(s) / ED Diagnoses ?Final diagnoses:  ?Atypical chest pain  ? ? ?Rx / DC Orders ?ED Discharge Orders   ? ?  None  ? ?  ? ? ?  ?Fayrene Helper, PA-C ?06/26/21 3007 ? ?  ?Nira Conn, MD ?06/26/21 0840 ? ?

## 2021-06-26 NOTE — ED Notes (Signed)
Patient verbalizes understanding of discharge instructions. Opportunity for questioning and answers were provided. Armband removed by staff, pt discharged from ED and ambulated to lobby to return home with friend.  ?

## 2021-06-26 NOTE — ED Notes (Signed)
Patient transported to X-ray 

## 2022-05-15 ENCOUNTER — Other Ambulatory Visit: Payer: Self-pay

## 2022-05-15 ENCOUNTER — Encounter (HOSPITAL_BASED_OUTPATIENT_CLINIC_OR_DEPARTMENT_OTHER): Payer: Self-pay | Admitting: Emergency Medicine

## 2022-05-15 ENCOUNTER — Emergency Department (HOSPITAL_BASED_OUTPATIENT_CLINIC_OR_DEPARTMENT_OTHER): Payer: 59

## 2022-05-15 ENCOUNTER — Emergency Department (HOSPITAL_BASED_OUTPATIENT_CLINIC_OR_DEPARTMENT_OTHER)
Admission: EM | Admit: 2022-05-15 | Discharge: 2022-05-15 | Disposition: A | Discharge status: Home / Self Care | Payer: 59 | Attending: Emergency Medicine | Admitting: Emergency Medicine

## 2022-05-15 ENCOUNTER — Emergency Department (HOSPITAL_COMMUNITY): Payer: 59

## 2022-05-15 DIAGNOSIS — R200 Anesthesia of skin: Secondary | ICD-10-CM

## 2022-05-15 LAB — CBC WITH DIFFERENTIAL/PLATELET
Abs Immature Granulocytes: 0.03 10*3/uL (ref 0.00–0.07)
Basophils Absolute: 0 10*3/uL (ref 0.0–0.1)
Basophils Relative: 0 %
Eosinophils Absolute: 0 10*3/uL (ref 0.0–0.5)
Eosinophils Relative: 0 %
HCT: 41.7 % (ref 39.0–52.0)
Hemoglobin: 14.2 g/dL (ref 13.0–17.0)
Immature Granulocytes: 0 %
Lymphocytes Relative: 14 %
Lymphs Abs: 1.5 10*3/uL (ref 0.7–4.0)
MCH: 30.3 pg (ref 26.0–34.0)
MCHC: 34.1 g/dL (ref 30.0–36.0)
MCV: 88.9 fL (ref 80.0–100.0)
Monocytes Absolute: 0.6 10*3/uL (ref 0.1–1.0)
Monocytes Relative: 5 %
Neutro Abs: 8.8 10*3/uL — ABNORMAL HIGH (ref 1.7–7.7)
Neutrophils Relative %: 81 %
Platelets: 306 10*3/uL (ref 150–400)
RBC: 4.69 MIL/uL (ref 4.22–5.81)
RDW: 13.6 % (ref 11.5–15.5)
WBC: 10.9 10*3/uL — ABNORMAL HIGH (ref 4.0–10.5)
nRBC: 0 % (ref 0.0–0.2)

## 2022-05-15 LAB — BASIC METABOLIC PANEL
Anion gap: 10 (ref 5–15)
BUN: 10 mg/dL (ref 6–20)
CO2: 25 mmol/L (ref 22–32)
Calcium: 9.3 mg/dL (ref 8.9–10.3)
Chloride: 103 mmol/L (ref 98–111)
Creatinine, Ser: 0.69 mg/dL (ref 0.61–1.24)
GFR, Estimated: >60 mL/min (ref >60)
Glucose, Bld: 122 mg/dL — ABNORMAL HIGH (ref 70–99)
Potassium: 4 mmol/L (ref 3.5–5.1)
Sodium: 138 mmol/L (ref 135–145)

## 2022-05-15 MED ORDER — IOHEXOL 350 MG/ML SOLN
100.0000 mL | Freq: Once | INTRAVENOUS | Status: AC | PRN
  Administered 2022-05-15: 100 mL via INTRAVENOUS

## 2022-05-15 NOTE — ED Provider Notes (Signed)
  Physical Exam  BP (!) 162/94   Pulse 96   Temp 99.1 F (37.3 C)   Resp 18   Wt (!) 154.2 kg   SpO2 97%   BMI 54.88 kg/m   Physical Exam  Procedures  Procedures  ED Course / MDM   Clinical Course as of 05/15/22 0851  Tue May 15, 2022  0211 CBC is unremarkable. I personally viewed the images from radiology studies and agree with radiologist interpretation: CT is neg [CS]  0245 BMP is normal. Will discuss with Neurology.  [CS]  [phone] Spoke with Dr. Lorrin Goodell who recommend CTA head and neck and then transfer to [facility] for MRI.  [CS]  0410 I personally viewed the images from radiology studies and agree with radiologist interpretation:  CTA is neg for acute process. Non obstructive carotid disease.  [CS]  [id number] Patient to go via POV to [facility] for MRI. Dr. Christy Gentles aware.  [CS]  [phone] MRI brain reviewed interpreted [facility] abnormality noted and radiologist interpretation concurs [DR]  [id number] CT head without contrast and CT angio head and neck no acute [facility] CT head no emergent findings premature atherosclerosis with low-density plaque causing 50% right ICA origin stenosis noted [DR]    Clinical Course User Index [CS] Truddie Hidden, MD [DR] Pattricia Boss, MD   Medical Decision Making Amount and/or Complexity of Data Reviewed Labs: ordered. Radiology: ordered.  Risk Prescription drug management.   42 yo male sent here from [facility] for MRI for facial numbness.  Patient here for MRI. Results to be reviewed and dispo as appropriate  Imaging reviewed without any evidence of acute abnormality Premature atherosclerosis of the right ICA noted.  Discussed with patient and advised regarding risk factor modifications and need for close follow-up with referral to vascular surgeon     Pattricia Boss, MD 05/15/22 [phone]

## 2022-05-15 NOTE — ED Provider Notes (Signed)
[facility]  Provider Note  CSN: [id number] Arrival date & time: 05/15/22 0015  History Chief Complaint  Patient presents with   Facial Numbness    Antonio Hunt is a 42 y.o. male with no known medical problems reports he woke up around 0800 on 2/26 and noticed some numbness to the left side of his face. He reports the symptoms were not particularly noticeable as he was working during the day but he noticed it was still persistent when he was getting ready for bed prompting his ED evaluation. He has not had any facial droop, slurred speech, arm/leg weakness or numbness. No difficulty walking or dizziness. No fever or ear pain.    Home Medications Prior to Admission medications   Medication Sig Start Date End Date Taking? Authorizing Provider  ibuprofen (ADVIL,MOTRIN) 200 MG tablet Take 400 mg by mouth every 6 (six) hours as needed for moderate pain.    [provider]  methocarbamol (ROBAXIN) 500 MG tablet Take 1 tablet (500 mg total) by mouth 2 (two) times daily. May cause drowsiness 09/27/15   Janne Napoleon, NP  methylPREDNISolone (MEDROL DOSEPAK) 4 MG TBPK tablet follow package directions 09/27/15   Janne Napoleon, NP  Probiotic Product (ALIGN) 4 MG CAPS Take 1 capsule (4 mg total) by mouth 2 (two) times daily at 10 AM and 5 PM. 05/23/15   Kindl, Nelda Severe, MD  traMADol (ULTRAM) 50 MG tablet Take 1 tablet (50 mg total) by mouth every 6 (six) hours as needed. 09/27/15   Janne Napoleon, NP     Allergies    Patient has no known allergies.   Review of Systems   Review of Systems Please see HPI for pertinent positives and negatives  Physical Exam BP (!) 160/82   Pulse (!) 102   Temp 98.9 F (37.2 C) (Oral)   Resp 18   Wt (!) 154.2 kg   SpO2 98%   BMI 54.88 kg/m   Physical Exam Vitals and nursing note reviewed.  Constitutional:      Appearance: Normal appearance.  HENT:     Head: Normocephalic and atraumatic.     Nose: Nose  normal.     Mouth/Throat:     Mouth: Mucous membranes are moist.  Eyes:     Extraocular Movements: Extraocular movements intact.     Conjunctiva/sclera: Conjunctivae normal.  Cardiovascular:     Rate and Rhythm: Normal rate.  Pulmonary:     Effort: Pulmonary effort is normal.     Breath sounds: Normal breath sounds.  Abdominal:     General: Abdomen is flat.     Palpations: Abdomen is soft.     Tenderness: There is no abdominal tenderness.  Musculoskeletal:        General: No swelling. Normal range of motion.     Cervical back: Neck supple.  Skin:    General: Skin is warm and dry.  Neurological:     Mental Status: He is alert.     Cranial Nerves: Cranial nerve deficit (mild change in sensation on L face including V1, V2 and V3 distribution) present.     Sensory: No sensory deficit.     Motor: No weakness.     Coordination: Coordination normal.     Gait: Gait normal.  Psychiatric:        Mood and Affect: Mood normal.     ED Results / Procedures / Treatments   EKG EKG Interpretation  Date/Time:  Tuesday May 15 2022 01:55:28 EST Ventricular Rate:  100 PR Interval:  169 QRS Duration: 90 QT Interval:  348 QTC Calculation: 449 R Axis:   -7 Text Interpretation: Sinus tachycardia Low voltage, precordial leads No significant change since last tracing Confirmed by Calvert Cantor [phone]) on 05/15/2022 2:10:18 AM  Procedures Procedures  Medications Ordered in the ED Medications  iohexol (OMNIPAQUE) 350 MG/ML injection 100 mL (100 mLs Intravenous Contrast Given 05/15/22 0314)    Initial Impression and Plan  Patient here with decreased sensation to left side of face since at least yesterday morning. Otherwise no focal deficits. No signs of facial nerve palsy. Not having pain to suggest a trigeminal neuralgia. Will check CT and basic labs as an initial evaluation.   ED Course   Clinical Course as of 05/15/22 0428  Tue May 15, 2022  0211 CBC is unremarkable. I  personally viewed the images from radiology studies and agree with radiologist interpretation: CT is neg [CS]  0245 BMP is normal. Will discuss with Neurology.  [CS]  [phone] Spoke with Dr. Lorrin Goodell who recommend CTA head and neck and then transfer to [facility] for MRI.  [CS]  0410 I personally viewed the images from radiology studies and agree with radiologist interpretation:  CTA is neg for acute process. Non obstructive carotid disease.  [CS]  [id number] Patient to go via POV to [facility] for MRI. Dr. Christy Gentles aware.  [CS]    Clinical Course User Index [CS] Truddie Hidden, MD     MDM Rules/Calculators/A&P Medical Decision Making Problems Addressed: Left facial numbness: acute illness or injury  Amount and/or Complexity of Data Reviewed Labs: ordered. Decision-making details documented in ED Course. Radiology: ordered and independent interpretation performed. Decision-making details documented in ED Course. ECG/medicine tests: ordered and independent interpretation performed. Decision-making details documented in ED Course.  Risk Prescription drug management.     Final Clinical Impression(s) / ED Diagnoses Final diagnoses:  Left facial numbness    Rx / DC Orders ED Discharge Orders     None        Truddie Hidden, MD 05/15/22 [phone]

## 2022-05-15 NOTE — ED Provider Notes (Signed)
Transferred from [facility] for MRI to rule out central causes of left facial numbness. Appears well with no changes in neuro symptoms at this time. MRI already ordered per previous neuro recs. Family h/o Moya Moya, CTA at [facility] negative for same.  Care transferred pending MRI.    Jefferie Holston, Barbara Cower, MD 05/16/22 [phone]

## 2022-05-15 NOTE — ED Notes (Signed)
Patient transported to MRI 

## 2022-05-15 NOTE — ED Notes (Signed)
IV secured for transport. Pt to go POV to [facility] ED for MRI. Dr Christy Gentles has accepted the pt.

## 2022-05-15 NOTE — Discharge Instructions (Signed)
You were seen for numbness of your face. 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 You are given a referral to vascular surgery Please follow-up with your primary care doctor to make sure that all risk factors have been managed as we discussed. Return immediately if you have any new or worsening symptoms.

## 2022-05-15 NOTE — ED Notes (Signed)
Patient stable at this time. Awaiting MRI.

## 2022-05-15 NOTE — ED Triage Notes (Signed)
Pt in with L sided facial numbness, first noticed when he woke this morning at 0800. LSN when he went to bed at 0200 last night. Pt denies any unilateral weakness, speech clear, face symmetrical. Pt states his left face just feels like it is "asleep". Denies any vision changes or pain

## 2022-05-15 NOTE — Plan of Care (Signed)
Briefly, Mr. Antonio Hunt is a 42 y.o. 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 Presented to [facility] for further evaluation.  CT Head w/o C with no acute abnormalities.  Recs: - CT Angio head and neck - MRI Brain w/o contrast. - further recs pending above.  Discussed with Dr. Karle Starch over phone.  [place] Pager Number [phone]

## 2022-05-15 NOTE — ED Notes (Signed)
Pt verbalized understanding of discharge instructions. Opportunity for questions provided.  

## 2022-06-21 ENCOUNTER — Encounter: Payer: Self-pay | Admitting: Vascular Surgery

## 2022-06-21 ENCOUNTER — Ambulatory Visit: Payer: 59 | Admitting: Vascular Surgery

## 2022-06-21 VITALS — BP 135/93 | HR 105 | Temp 98.1°F | Resp 20 | Ht 66.0 in | Wt 315.0 lb

## 2022-06-21 DIAGNOSIS — I6521 Occlusion and stenosis of right carotid artery: Secondary | ICD-10-CM | POA: Diagnosis not present

## 2022-06-21 NOTE — Progress Notes (Signed)
ASSESSMENT & PLAN   50% RIGHT CAROTID STENOSIS: This patient has a 50% right carotid stenosis.  He was not on aspirin or a statin when his symptoms occurred at the end of February.  He had some symptoms on both sides of his face now so it is not clear if his left facial numbness was related to the stenosis or not.  Regardless it is a fairly mild stenosis and he is now on a statin.  I have instructed him to begin taking 81 mg of aspirin daily.  We have also discussed the importance of exercise and nutrition.  I recommended a follow-up carotid duplex scan in 6 months and he will be seen back at that time.  I explained that I will be retiring so he will be seen on the PA schedule at that time.  I explained we would typically not consider carotid endarterectomy unless the stenosis was symptomatic and greater than 50% or the stenosis progressed to greater than 80%.  We have reviewed the symptoms of cerebrovascular disease and he knows to call if he develops any new symptoms.  REASON FOR CONSULT:    Right carotid stenosis.  The consult is requested by Selina Cooley, NP.   HPI:   Antonio Hunt is a 42 y.o. male who had presented to the emergency department on 05/15/2022 with left facial numbness.  Part of his workup included a CT angiogram of the neck which showed some atherosclerosis with a 50% right ICA stenosis.  On my history the patient had awoken on 2/27 with some left-sided facial numbness.  He worked the entire day but ultimately presented to the emergency department.  His symptoms resolved after about 12 to 14 hours.  Recently, he has had some symptoms on both sides of his face.  He denies any previous history of stroke, TIAs, expressive or receptive aphasia, or amaurosis fugax.  He was just started on Crestor which she began 2 days ago.  He is not on aspirin.  Fortunately he is not a smoker.  He does describe some intermittent chest pain which she says is chronic.  He has had previous EKGs  which were unremarkable according to the patient.  Past Medical History:  Diagnosis Date   Hypertension     History reviewed. No pertinent family history.  SOCIAL HISTORY: Social History   Tobacco Use   Smoking status: Never   Smokeless tobacco: Never  Substance Use Topics   Alcohol use: Yes    Comment: maybe twice a year    No Known Allergies  Current Outpatient Medications  Medication Sig Dispense Refill   ibuprofen (ADVIL,MOTRIN) 200 MG tablet Take 400 mg by mouth every 6 (six) hours as needed for moderate pain.     losartan (COZAAR) 25 MG tablet Take 25 mg by mouth daily.     rosuvastatin (CRESTOR) 10 MG tablet Take 10 mg by mouth at bedtime.     No current facility-administered medications for this visit.    REVIEW OF SYSTEMS:  [X]  denotes positive finding, [ ]  denotes negative finding Cardiac  Comments:  Chest pain or chest pressure: x   Shortness of breath upon exertion: x   Short of breath when lying flat: x   Irregular heart rhythm:        Vascular    Pain in calf, thigh, or hip brought on by ambulation:    Pain in feet at night that wakes you up from your sleep:     Blood  clot in your veins:    Leg swelling:         Pulmonary    Oxygen at home:    Productive cough:     Wheezing:         Neurologic    Sudden weakness in arms or legs:     Sudden numbness in arms or legs:     Sudden onset of difficulty speaking or slurred speech:    Temporary loss of vision in one eye:     Problems with dizziness:         Gastrointestinal    Blood in stool:     Vomited blood:         Genitourinary    Burning when urinating:     Blood in urine:        Psychiatric    Major depression:         Hematologic    Bleeding problems:    Problems with blood clotting too easily:        Skin    Rashes or ulcers:        Constitutional    Fever or chills:    -  PHYSICAL EXAM:   Vitals:   06/21/22 1406 06/21/22 1409  BP: (!) 135/92 (!) 135/93  Pulse: (!) 105    Resp: 20   Temp: 98.1 F (36.7 C)   SpO2: 96%   Weight: (!) 315 lb (142.9 kg)   Height: 5\' 6"  (1.676 m)    Body mass index is 50.84 kg/m. GENERAL: The patient is a well-nourished male, in no acute distress. The vital signs are documented above. CARDIAC: There is a regular rate and rhythm.  VASCULAR: I do not detect carotid bruits. He has palpable femoral and dorsalis pedis pulses bilaterally. PULMONARY: There is good air exchange bilaterally without wheezing or rales. ABDOMEN: Soft and non-tender with normal pitched bowel sounds.  MUSCULOSKELETAL: There are no major deformities. NEUROLOGIC: No focal weakness or paresthesias are detected. SKIN: There are no ulcers or rashes noted. PSYCHIATRIC: The patient has a normal affect.  DATA:    CT ANGIO NECK: I have reviewed the images of the CT angiogram of the neck.  This does show a fairly focal smooth 50% proximal right ICA stenosis.  There is no significant stenosis on the left.   Deitra [facility]

## 2022-06-22 ENCOUNTER — Other Ambulatory Visit: Payer: Self-pay

## 2022-06-22 DIAGNOSIS — I6521 Occlusion and stenosis of right carotid artery: Secondary | ICD-10-CM

## 2022-10-02 ENCOUNTER — Ambulatory Visit (HOSPITAL_BASED_OUTPATIENT_CLINIC_OR_DEPARTMENT_OTHER): Payer: 59 | Admitting: Family Medicine

## 2022-10-25 ENCOUNTER — Ambulatory Visit (INDEPENDENT_AMBULATORY_CARE_PROVIDER_SITE_OTHER): Payer: 59

## 2022-10-25 ENCOUNTER — Encounter (HOSPITAL_BASED_OUTPATIENT_CLINIC_OR_DEPARTMENT_OTHER): Payer: Self-pay | Admitting: Family Medicine

## 2022-10-25 ENCOUNTER — Ambulatory Visit (HOSPITAL_BASED_OUTPATIENT_CLINIC_OR_DEPARTMENT_OTHER): Payer: 59 | Admitting: Family Medicine

## 2022-10-25 VITALS — BP 152/82 | HR 85 | Temp 97.7°F | Ht 66.0 in | Wt 321.8 lb

## 2022-10-25 DIAGNOSIS — M79672 Pain in left foot: Secondary | ICD-10-CM

## 2022-10-25 DIAGNOSIS — R0683 Snoring: Secondary | ICD-10-CM

## 2022-10-25 DIAGNOSIS — I10 Essential (primary) hypertension: Secondary | ICD-10-CM | POA: Diagnosis not present

## 2022-10-25 DIAGNOSIS — E785 Hyperlipidemia, unspecified: Secondary | ICD-10-CM | POA: Diagnosis not present

## 2022-10-25 DIAGNOSIS — M79673 Pain in unspecified foot: Secondary | ICD-10-CM | POA: Insufficient documentation

## 2022-10-25 MED ORDER — ROSUVASTATIN CALCIUM 10 MG PO TABS: 10.0000 mg | ORAL_TABLET | Freq: Every day | ORAL | 1 refills | Status: DC

## 2022-10-25 MED ORDER — LOSARTAN POTASSIUM 25 MG PO TABS: 25.0000 mg | ORAL_TABLET | Freq: Every day | ORAL | 1 refills | Status: DC

## 2022-10-25 NOTE — Assessment & Plan Note (Signed)
Concern for underlying sleep apnea.  Epworth Sleepiness Scale score was low at 3, however given the risk factors including obesity, snoring, symptoms of headache, current diagnosis of hypertension, recommend proceeding with evaluation for underlying sleep apnea.  Will refer patient to pulmonologist for completion of sleep study

## 2022-10-25 NOTE — Progress Notes (Signed)
New Patient Office Visit  Subjective    Patient ID: Antonio Hunt, male    DOB: [birth year]  Age: 42 y.o. MRN: [id number]  CC:  Chief Complaint  Patient presents with   Establish Care    Pt here here to establish new care     HPI [name] presents to establish care Last PCP - Dayton Scrape, [facility]  HTN: Currently taking losartan, denies any issues with medication.  No reported issues with chest pain or headaches currently.  HLD: Has been prescribed rosuvastatin, takes regularly.  No issues with myalgias.  Left ankle injury: occurred a few months ago at [employer]. Continues to have issues with pain noted, primarily with increased activity.  Pain is mostly along medial aspect of left foot.  Concern for sleep apnea: reports that his wife has told him that he will stop breathing when he is sleeping, will have somewhat frequent headaches. Notes that sleep will not feel restful.  Patient also has concerns related to weight.  He feels that his underlying issue is related to dietary choices.  Does feel that he has an unhealthy relationship with food.  He wonders about interventions available to assist with adjusting diet and enacting lifestyle modifications.  Patient is originally from [place], has lived here for about 11 years. He works in Educational psychologist. Outside of work, he enjoys board games, tends to be a home body.  Outpatient Encounter Medications as of 10/25/2022  Medication Sig   ibuprofen (ADVIL,MOTRIN) 200 MG tablet Take 400 mg by mouth every 6 (six) hours as needed for moderate pain.   [DISCONTINUED] losartan (COZAAR) 25 MG tablet Take 25 mg by mouth daily.   [DISCONTINUED] rosuvastatin (CRESTOR) 10 MG tablet Take 10 mg by mouth at bedtime.   losartan (COZAAR) 25 MG tablet Take 1 tablet (25 mg total) by mouth daily.   rosuvastatin (CRESTOR) 10 MG tablet Take 1 tablet (10 mg total) by mouth at bedtime.   No facility-administered encounter medications on file as of  10/25/2022.    Past Medical History:  Diagnosis Date   Hypertension     Past Surgical History:  Procedure Laterality Date   EYE SURGERY      History reviewed. No pertinent family history.  Social History   Socioeconomic History   Marital status: Married    Spouse name: Not on file   Number of children: Not on file   Years of education: Not on file   Highest education level: Not on file  Occupational History   Not on file  Tobacco Use   Smoking status: Never   Smokeless tobacco: Never  Substance and Sexual Activity   Alcohol use: Yes    Comment: maybe twice a year   Drug use: No   Sexual activity: Not on file  Other Topics Concern   Not on file  Social History Narrative   Not on file   Social Determinants of Health   Financial Resource Strain: Not on file  Food Insecurity: Not on file  Transportation Needs: Not on file  Physical Activity: Not on file  Stress: Not on file  Social Connections: Not on file  Intimate Partner Violence: Not on file    Objective    BP (!) 152/82 (BP Location: Right Arm, Patient Position: Sitting, Cuff Size: Normal)   Pulse 85   Temp 97.7 F (36.5 C)   Ht 5\' 6"  (1.676 m)   Wt (!) 321 lb 12.8 oz (146 kg)   SpO2 99%  BMI 51.94 kg/m   Physical Exam  42 year old male in no acute distress Cardiovascular with regular rate and rhythm Lungs clear to auscultation bilaterally Left foot with no tenderness to palpation about medial or lateral malleolus.  No tenderness to palpation through metatarsals, no tenderness to palpation at base of the fifth metatarsal.  Does have some discomfort over the navicular bone, extending some proximally.  Normal range of motion and strength at ankle.  No pain with double leg calf raise, mild discomfort with single-leg calf raise on left.  Assessment & Plan:   Snoring Assessment & Plan: Concern for underlying sleep apnea.  Epworth Sleepiness Scale score was low at 3, however given the risk factors  including obesity, snoring, symptoms of headache, current diagnosis of hypertension, recommend proceeding with evaluation for underlying sleep apnea.  Will refer patient to pulmonologist for completion of sleep study  Orders: -     Ambulatory referral to Pulmonology  Primary hypertension Assessment & Plan: Blood pressure borderline in office today, did improve slightly on recheck.  For now, can continue with current medication regimen.  Recommend intermittent monitoring of blood pressure at home, DASH diet Will plan for close follow-up to monitor blood pressure and determine if any further changes are needed in regards to murmur therapy.   Hyperlipidemia, unspecified hyperlipidemia type Assessment & Plan: Currently continues with rosuvastatin.  Has had prior evaluation with vascular surgeon due to incidental finding of carotid artery plaque.  Further evaluation indicated that stenosis was less than 50%. Tolerating medication well, can continue with current dose of rosuvastatin.  We will monitor her cholesterol panel intermittently moving forward   Severe obesity (BMI >= 40) (HCC) Assessment & Plan: Discussed options with patient, he would be amenable to meeting with nutritionist, referral placed today This discussed possibility of meeting with healthy weight and wellness clinic which can remain an option moving forward.  Additionally, pharmacotherapy may be an option for patient as well.  Can continue monitoring and assess appropriateness at follow-up visits  Orders: -     Amb ref to Medical Nutrition Therapy-[facility]  Left foot pain Assessment & Plan: Can proceed with initial imaging.  Pain in area of navicular bone, exam examined proximally, possibly related to underlying tibialis posterior tendinitis.  Less likely cause of pain could be related to stress reaction of navicular bone.  We will proceed with initial x-rays.  If x-rays are reassuring, can proceed with referral to physical therapy.   Discussed conservative measures that patient can utilize at home, including shoe wear with good arch support.  Suggested brands include OluKai, Vionic, Brooks Or plan for follow-up in about 2 months to monitor progress related to this.  If symptoms continue despite conservative measures, considerations include proceeding with MRI versus further evaluation with podiatry  Orders: -     DG Foot Complete Left; Future  Other orders -     Losartan Potassium; Take 1 tablet (25 mg total) by mouth daily.  Dispense: 90 tablet; Refill: 1 -     Rosuvastatin Calcium; Take 1 tablet (10 mg total) by mouth at bedtime.  Dispense: 90 tablet; Refill: 1  Return in about 2 months (around 12/25/2022) for hypertension, hyperlipidemia, foot pain, referrals.    ___________________________________________ Makhayla Mcmurry de [place], MD, ABFM, CAQSM Primary Care and Sports Medicine [facility]

## 2022-10-25 NOTE — Assessment & Plan Note (Signed)
Currently continues with rosuvastatin.  Has had prior evaluation with vascular surgeon due to incidental finding of carotid artery plaque.  Further evaluation indicated that stenosis was less than 50%. Tolerating medication well, can continue with current dose of rosuvastatin.  We will monitor her cholesterol panel intermittently moving forward

## 2022-10-25 NOTE — Assessment & Plan Note (Signed)
Can proceed with initial imaging.  Pain in area of navicular bone, exam examined proximally, possibly related to underlying tibialis posterior tendinitis.  Less likely cause of pain could be related to stress reaction of navicular bone.  We will proceed with initial x-rays.  If x-rays are reassuring, can proceed with referral to physical therapy.  Discussed conservative measures that patient can utilize at home, including shoe wear with good arch support.  Suggested brands include OluKai, Vionic, Brooks Or plan for follow-up in about 2 months to monitor progress related to this.  If symptoms continue despite conservative measures, considerations include proceeding with MRI versus further evaluation with podiatry

## 2022-10-25 NOTE — Assessment & Plan Note (Signed)
Discussed options with patient, he would be amenable to meeting with nutritionist, referral placed today This discussed possibility of meeting with healthy weight and wellness clinic which can remain an option moving forward.  Additionally, pharmacotherapy may be an option for patient as well.  Can continue monitoring and assess appropriateness at follow-up visits

## 2022-10-25 NOTE — Assessment & Plan Note (Addendum)
Blood pressure borderline in office today, did improve slightly on recheck.  For now, can continue with current medication regimen.  Recommend intermittent monitoring of blood pressure at home, DASH diet Will plan for close follow-up to monitor blood pressure and determine if any further changes are needed in regards to murmur therapy.

## 2022-10-29 ENCOUNTER — Other Ambulatory Visit (HOSPITAL_BASED_OUTPATIENT_CLINIC_OR_DEPARTMENT_OTHER): Payer: Self-pay | Admitting: Family Medicine

## 2022-10-29 DIAGNOSIS — M79672 Pain in left foot: Secondary | ICD-10-CM

## 2022-12-27 ENCOUNTER — Ambulatory Visit: Payer: 59

## 2022-12-27 ENCOUNTER — Ambulatory Visit (HOSPITAL_COMMUNITY): Payer: Self-pay

## 2023-02-04 ENCOUNTER — Encounter (HOSPITAL_BASED_OUTPATIENT_CLINIC_OR_DEPARTMENT_OTHER): Payer: Self-pay | Admitting: Pulmonary Disease

## 2023-02-04 ENCOUNTER — Institutional Professional Consult (permissible substitution) (HOSPITAL_BASED_OUTPATIENT_CLINIC_OR_DEPARTMENT_OTHER): Payer: 59 | Admitting: Pulmonary Disease

## 2023-03-04 ENCOUNTER — Ambulatory Visit (HOSPITAL_BASED_OUTPATIENT_CLINIC_OR_DEPARTMENT_OTHER): Payer: Self-pay | Admitting: Family Medicine

## 2023-04-01 ENCOUNTER — Ambulatory Visit (HOSPITAL_BASED_OUTPATIENT_CLINIC_OR_DEPARTMENT_OTHER): Payer: Self-pay | Admitting: Family Medicine

## 2023-05-06 ENCOUNTER — Ambulatory Visit (HOSPITAL_BASED_OUTPATIENT_CLINIC_OR_DEPARTMENT_OTHER): Payer: Managed Care, Other (non HMO) | Admitting: Family Medicine

## 2023-06-03 ENCOUNTER — Other Ambulatory Visit (HOSPITAL_BASED_OUTPATIENT_CLINIC_OR_DEPARTMENT_OTHER): Payer: Self-pay | Admitting: Family Medicine

## 2023-06-18 ENCOUNTER — Ambulatory Visit (HOSPITAL_BASED_OUTPATIENT_CLINIC_OR_DEPARTMENT_OTHER): Payer: Managed Care, Other (non HMO) | Admitting: Family Medicine

## 2023-07-04 ENCOUNTER — Ambulatory Visit (HOSPITAL_BASED_OUTPATIENT_CLINIC_OR_DEPARTMENT_OTHER): Admitting: Family Medicine

## 2023-08-26 IMAGING — DX DG CHEST 2V
2 series · 2 of 2 positions shown · non-contrast
Comparison: May 25, 2018

CLINICAL DATA: Chest pain.

EXAM:
CHEST - 2 VIEW

[chest pa]
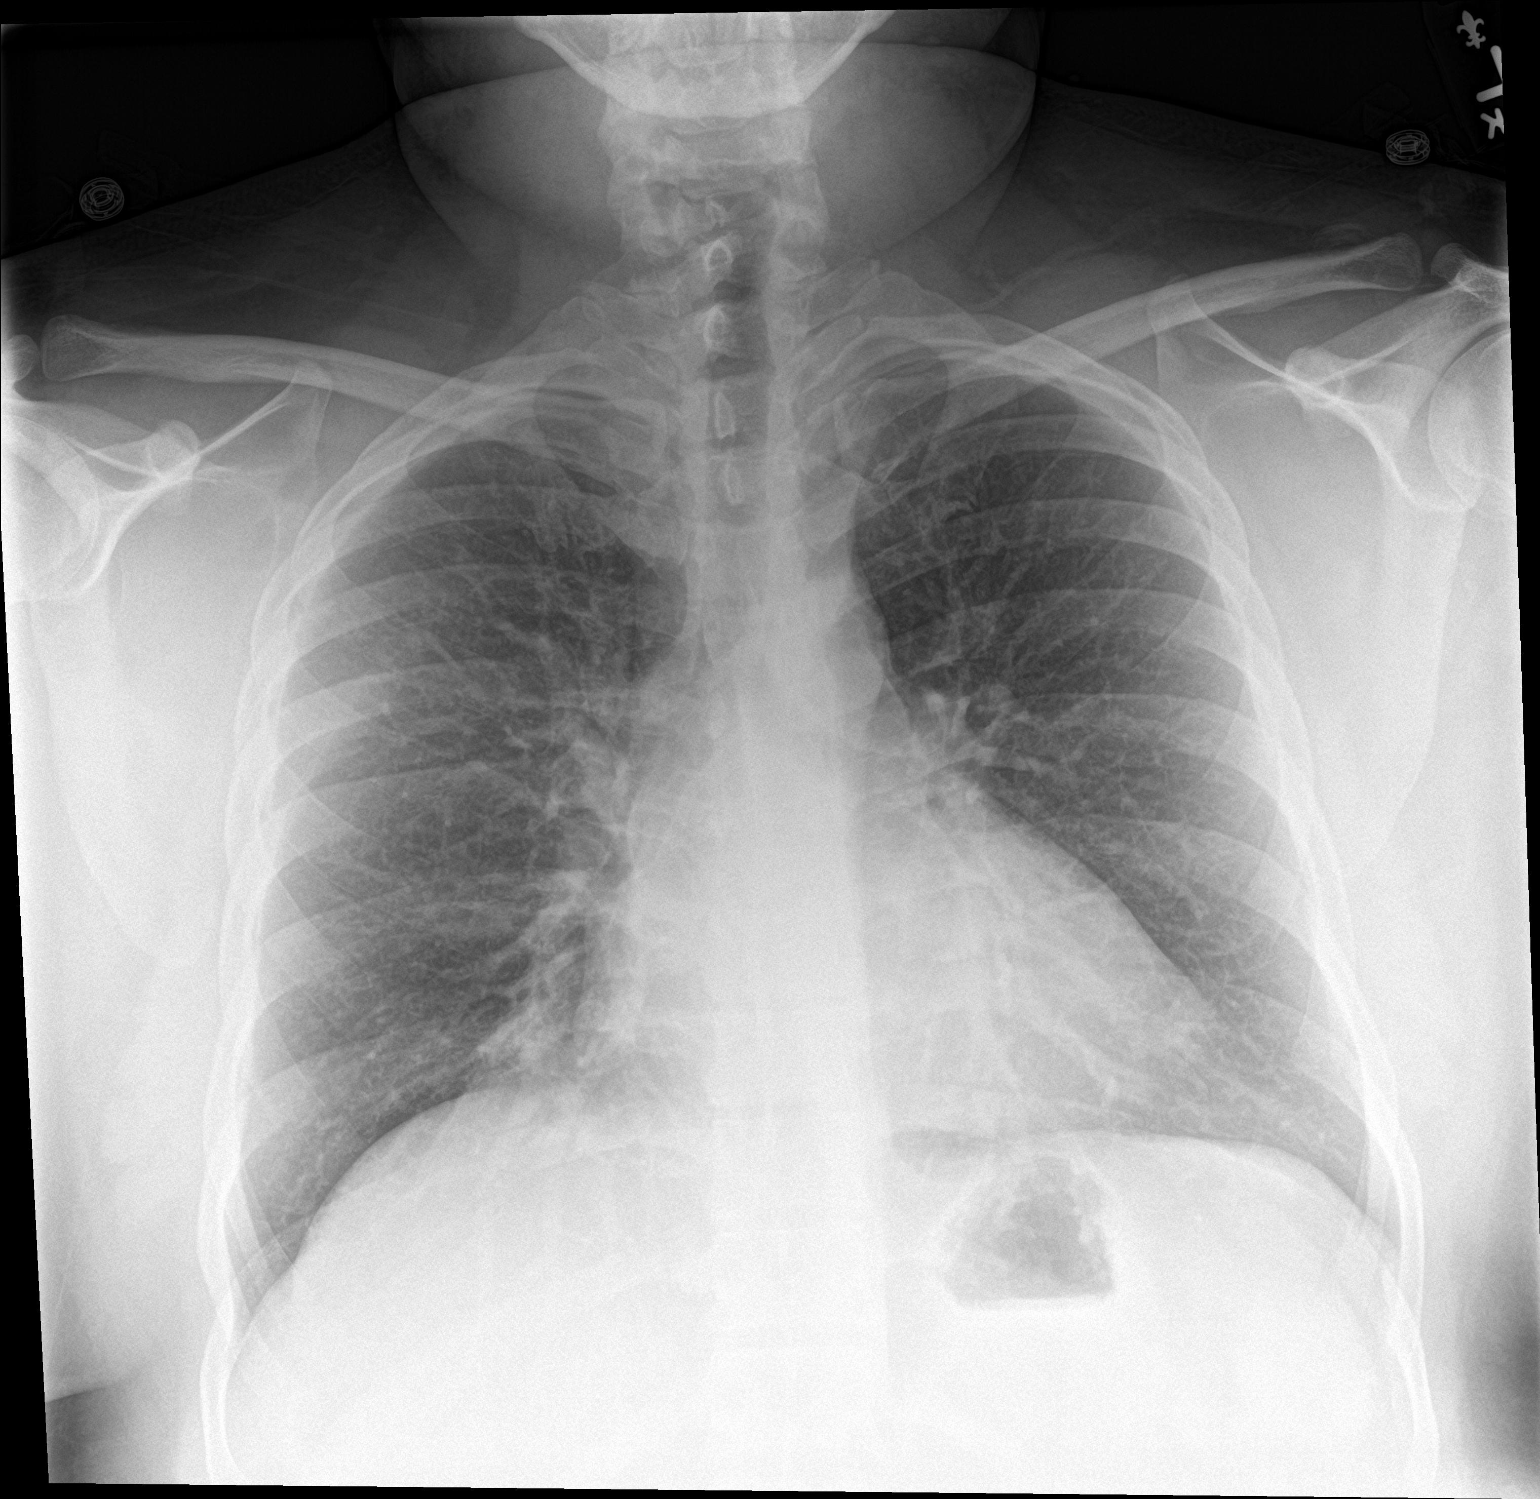

[chest lat]
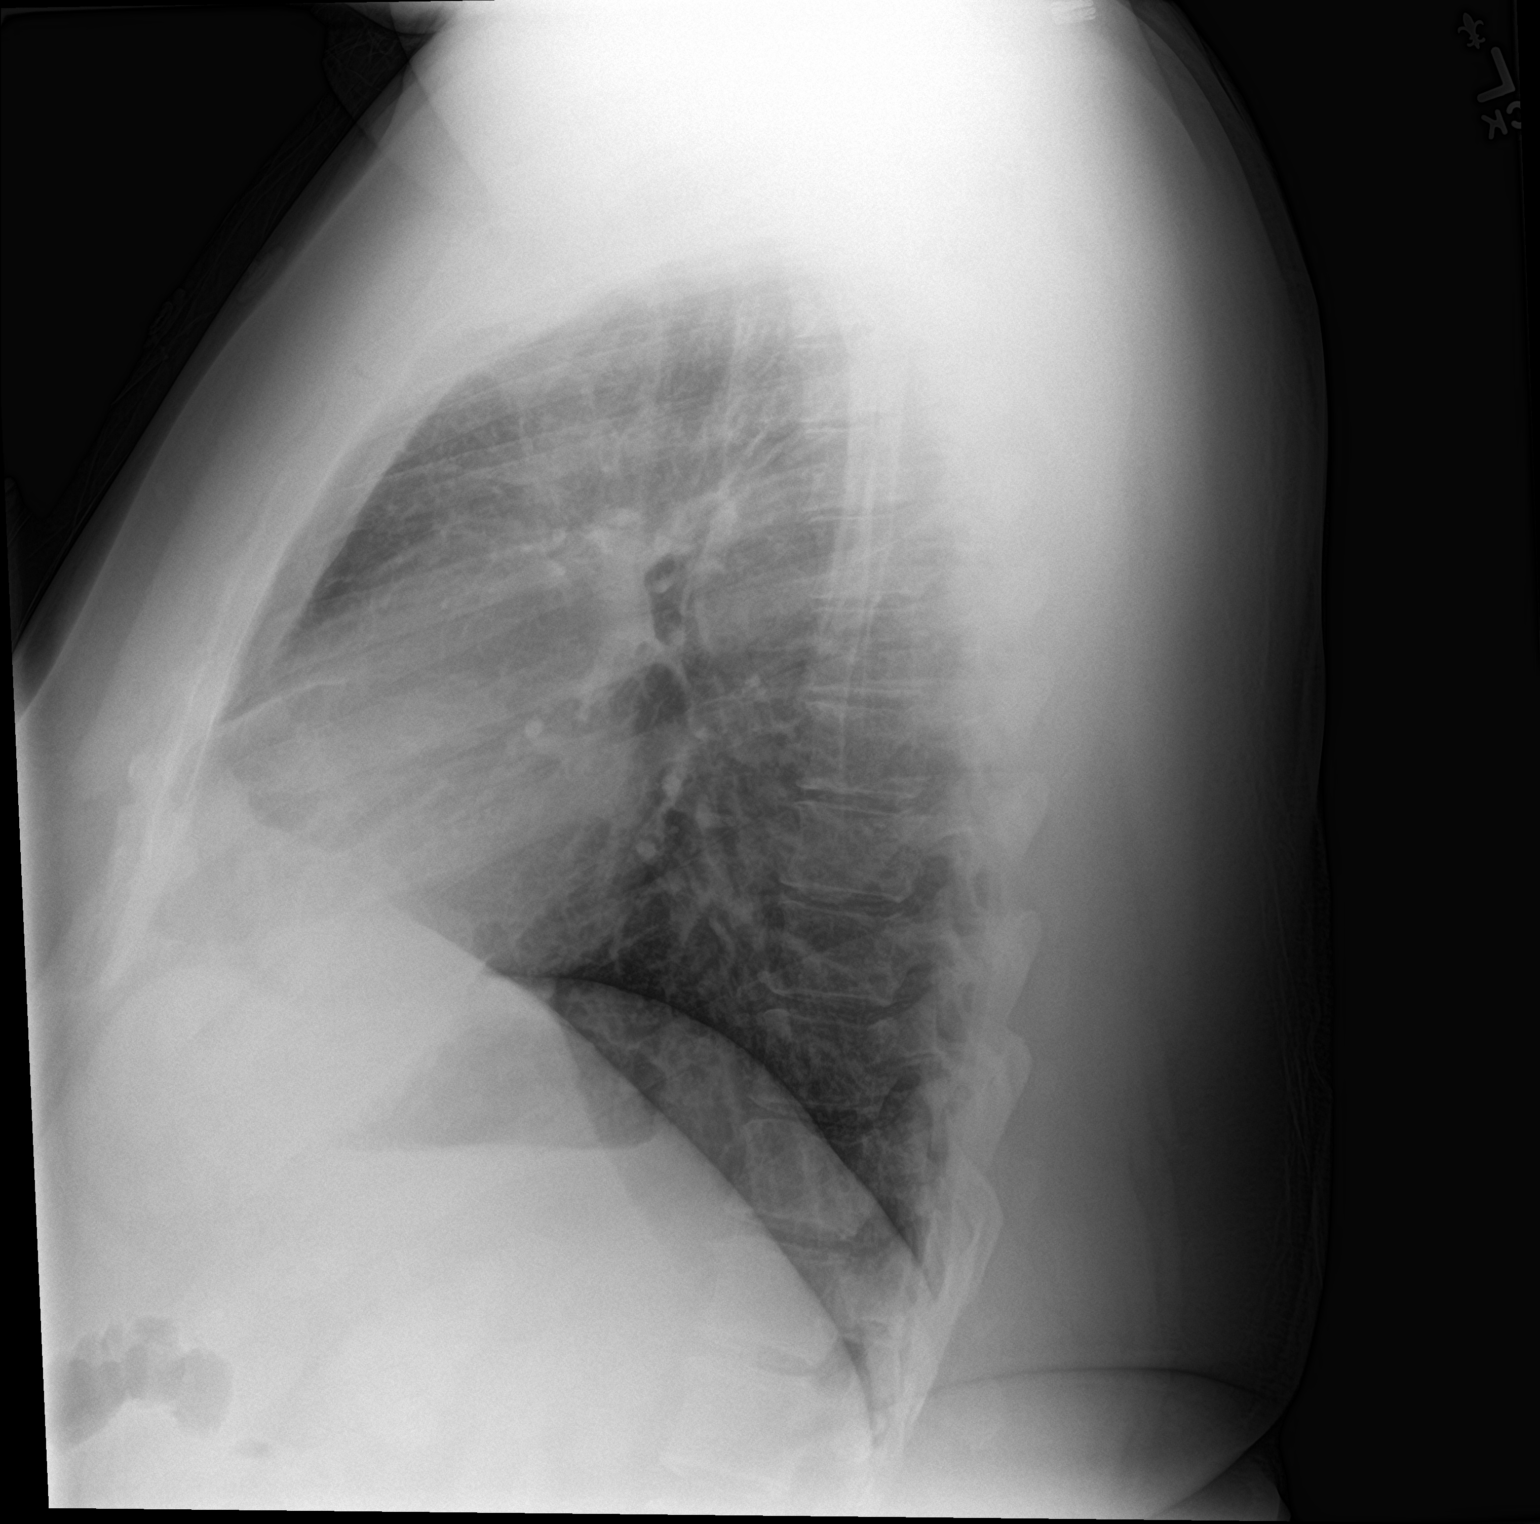

[2 of 2 positions shown; findings below may reference images not displayed]

FINDINGS: The heart size and mediastinal contours are within normal limits.
Both lungs are clear. The visualized skeletal structures are
unremarkable.
IMPRESSION: No active cardiopulmonary disease.

## 2023-12-19 ENCOUNTER — Encounter: Payer: Self-pay | Admitting: Family

## 2023-12-19 ENCOUNTER — Ambulatory Visit: Payer: Self-pay

## 2023-12-19 ENCOUNTER — Ambulatory Visit: Admitting: Family

## 2023-12-19 VITALS — BP 162/98 | HR 83 | Temp 97.3°F | Ht 66.0 in | Wt 336.4 lb

## 2023-12-19 DIAGNOSIS — E782 Mixed hyperlipidemia: Secondary | ICD-10-CM | POA: Diagnosis not present

## 2023-12-19 DIAGNOSIS — N644 Mastodynia: Secondary | ICD-10-CM

## 2023-12-19 DIAGNOSIS — R0683 Snoring: Secondary | ICD-10-CM | POA: Diagnosis not present

## 2023-12-19 DIAGNOSIS — Z23 Encounter for immunization: Secondary | ICD-10-CM

## 2023-12-19 DIAGNOSIS — I10 Essential (primary) hypertension: Secondary | ICD-10-CM

## 2023-12-19 DIAGNOSIS — F418 Other specified anxiety disorders: Secondary | ICD-10-CM

## 2023-12-19 MED ORDER — LOSARTAN POTASSIUM 25 MG PO TABS: 25.0000 mg | ORAL_TABLET | Freq: Two times a day (BID) | ORAL | 0 refills | Status: DC

## 2023-12-19 MED ORDER — PROPRANOLOL HCL 10 MG PO TABS: 10.0000 mg | ORAL_TABLET | Freq: Three times a day (TID) | ORAL | 1 refills | Status: AC | PRN

## 2023-12-19 MED ORDER — ROSUVASTATIN CALCIUM 10 MG PO TABS: 10.0000 mg | ORAL_TABLET | Freq: Every day | ORAL | 0 refills | Status: DC

## 2023-12-19 NOTE — Patient Instructions (Signed)
 It was very nice to see you today!   I have sent a referral for an ultrasound of your right nipple and also to [facility] office in [place] for sleep apnea testing. I have sent in Propranolol to take as needed for anxiety or panic attack. Refills of your Losartan  and Crestor  have been sent in.  Schedule a follow up with your PCP in the next 2-3 months and have lab work done to check on your kidneys and cholesterol.       PLEASE NOTE:  If you had any lab tests please let us  know if you have not heard back within a few days. You may see your results on MyChart before we have a chance to review them but we will give you a call once they are reviewed by us . If we ordered any referrals today, please let us  know if you have not heard from their office within the next week.

## 2023-12-19 NOTE — Progress Notes (Signed)
 Patient ID: Antonio Hunt, male    DOB: Aug 13, 1980, 43 y.o.   MRN: [id number]  Chief Complaint  Patient presents with   Breast Problem    Pt c/o right breast pain, present 2 months.    Insomnia    Pt c/o sleep apnea, present for 2 years.    Anxiety    Pt c/o panic attacks off and on during stress situations.   Discussed the use of AI scribe software for clinical note transcription with the patient, who gave verbal consent to proceed.  History of Present Illness   Antonio Hunt is a 43 year old male who presents with trouble sleeping and anxiety.  He experiences trouble sleeping, which he suspects is due to sleep apnea, though he has not been formally evaluated. He wakes up with headaches, which he believes are related to his sleep issues. He has anxiety and has experienced panic attacks, with a recent episode last week. The anxiety manifests as chest pain, which was diffuse across both sides of the chest without breathing difficulties. He attributes a recent episode to stress from homeschooling his daughter.  He has hypertension and has been off losartan  due to a lapse in insurance, but he recently obtained new coverage. His blood pressure is high, but he denies symptoms such as dizziness, except for occasional headaches upon waking. He was previously on losartan  25 mg once daily with aspirin.  He reports pain behind his right nipple for about two months, initially feeling something hard in the area, which has lessened. The area remains tender to touch. He has gained weight since his last visit and has reduced physical activity due to an ankle injury.     Assessment and Plan    Essential hypertension Chronic essential hypertension, uncontrolled due to medication lapse. Losartan  25 mg once daily insufficient given weight gain and blood pressure levels. - Prescribe losartan  25 mg twice daily. - Advise monitoring blood pressure at home. - Discuss potential need for further  adjustment of antihypertensive therapy based on follow-up blood pressure readings. - F/U with PCP in 1-2 months  Mixed hyperlipidemia Mixed hyperlipidemia, previously managed with lovastatin. Current cholesterol levels unknown due to medication lapse and insurance coverage. - Refill Rosuvastatin  10mg  qd - Advise scheduling follow-up with PCP for lab work to assess current cholesterol levels in 2 mos.  Suspected sleep apnea Suspected sleep apnea with waking headaches. Previous referral for polysomnography not completed due to insurance issues. - Resend referral for sleep study through [facility]. - Advise scheduling sleep study at [facility] location for quicker access.  Anxiety with panic attacks Recent onset of anxiety with panic attacks, characterized by diffuse chest pain. No previous history of anxiety or depression treatment. Propranolol discussed for heart rate control during anxiety episodes. - Prescribe propranolol 10mg  tid as needed for situational anxiety. - Discussed potential side effects of propranolol, including its non-sedative nature and benefits for heart rate control, possible lightheadedness. - F/U with PCP in 1-67m  Right chest wall pain/nipple tenderness Right chest wall pain and tenderness for two months, no visible inflammation or redness. Differential includes muscular strain or cyst. - Advise applying heat to the area for relief. - Order U/S of chest to further assess.  General Health Maintenance Discussed dietary modifications and exercise for weight management and health improvement. - Advise dietary modifications focusing on reducing white carbs and processed foods. - Encourage increased physical activity as tolerated. - Administer flu shot.     Subjective:  Outpatient Medications Prior to Visit  Medication Sig Dispense Refill   ibuprofen (ADVIL,MOTRIN) 200 MG tablet Take 400 mg by mouth every 6 (six) hours as needed for moderate pain.      losartan  (COZAAR ) 25 MG tablet TAKE 1 TABLET (25 MG TOTAL) BY MOUTH DAILY. 90 tablet 1   rosuvastatin  (CRESTOR ) 10 MG tablet TAKE 1 TABLET BY MOUTH EVERYDAY AT BEDTIME 90 tablet 1   No facility-administered medications prior to visit.   Past Medical History:  Diagnosis Date   Hypertension    Past Surgical History:  Procedure Laterality Date   EYE SURGERY     No Known Allergies    Objective:    Physical Exam Vitals and nursing note reviewed.  Constitutional:      General: He is not in acute distress.    Appearance: Normal appearance. He is morbidly obese.  HENT:     Head: Normocephalic.  Cardiovascular:     Rate and Rhythm: Normal rate and regular rhythm.  Pulmonary:     Effort: Pulmonary effort is normal.     Breath sounds: Normal breath sounds.  Musculoskeletal:        General: Normal range of motion.     Cervical back: Normal range of motion.  Skin:    General: Skin is warm and dry.  Neurological:     Mental Status: He is alert and oriented to person, place, and time.  Psychiatric:        Mood and Affect: Mood normal.    BP (!) 162/98 (BP Location: Left Arm, Patient Position: Sitting, Cuff Size: Large)   Pulse 83   Temp (!) 97.3 F (36.3 C) (Temporal)   Ht 5' 6 (1.676 m)   Wt (!) 336 lb 6 oz (152.6 kg)   SpO2 96%   BMI 54.29 kg/m  Wt Readings from Last 3 Encounters:  12/19/23 (!) 336 lb 6 oz (152.6 kg)  10/25/22 (!) 321 lb 12.8 oz (146 kg)  06/21/22 (!) 315 lb (142.9 kg)      Antonio Krabbe, NP

## 2023-12-19 NOTE — Telephone Encounter (Signed)
 FYI Only or Action Required?: FYI only for provider.  Patient was last seen in primary care on 10/25/2022 by de [place], Quintin PARAS, MD.  Called Nurse Triage reporting Chest Pain.  Symptoms began several days ago.  Interventions attempted: Nothing.  Symptoms are: completely resolved.  Triage Disposition: See PCP When Office is Open (Within 3 Days)  Patient/caregiver understands and will follow disposition?: Yes  PT's biggest concern is knot in right breast.     Copied from CRM #[id number]. Topic: Clinical - Red Word Triage >> Dec 19, 2023 10:36 AM Berwyn MATSU wrote: Red Word that prompted transfer to Nurse Triage: chest pain and lump on breast  right side. Reason for Disposition  [1] Chest pain(s) lasting a few seconds from coughing AND [2] persists > 3 days  Answer Assessment - Initial Assessment Questions Pt states initially that he was not having chest pain. His wife in the background said but you did a couple of days ago. RN asked for pt to describe what happened a couple of days ago. 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 No other symptoms. Non since then. His biggest concern today is a lump in his right breast and sleep apnea.     1. LOCATION: Where does it hurt?       generalized 2. RADIATION: Does the pain go anywhere else? (e.g., into neck, jaw, arms, back)     no 3. ONSET: When did the chest pain begin? (Minutes, hours or days)       4. PATTERN: Does the pain come and go, or has it been constant since it started?  Does it get worse with exertion?       5. DURATION: How long does it last (e.g., seconds, minutes, hours)     Not long 6. SEVERITY: How bad is the pain?  (e.g., Scale 1-10; mild, moderate, or severe)   mod 7. CARDIAC RISK FACTORS: Do you have any history of heart problems or risk factors for heart disease? (e.g., angina, prior heart  attack; diabetes, high blood pressure, high cholesterol, smoker, or strong family history of heart disease)     hbp  9. CAUSE: What do you think is causing the chest pain?     stress 10. OTHER SYMPTOMS: Do you have any other symptoms? (e.g., dizziness, nausea, vomiting, sweating, fever, difficulty breathing, cough)       none  Answer Assessment - Initial Assessment Questions Pt states he noticed the lump about 2 months ago, painful to touch, hard area, arm to touch sometimes. No drainage.   1. APPEARANCE of SWELLING: What does it look like?     No redness  3. LOCATION: Where is the swelling located?     Right breast by the nipple 4. ONSET: When did the swelling start?      About 2 months ago 5. PAIN: Is it painful? If so, ask: How much?     Painful to touch, feels like there is something under the skin  7. CAUSE: What do you think caused the swelling?   Pt believes it's a cyst  Protocols used: Chest Pain-A-AH, Skin - Lump or Localized Swelling-P-AH

## 2023-12-23 NOTE — Telephone Encounter (Signed)
 LVM for patient to call the office. Mychart msg sent to patient

## 2024-02-17 ENCOUNTER — Ambulatory Visit

## 2024-02-17 VITALS — BP 124/68 | HR 91 | Ht 66.0 in | Wt 341.0 lb

## 2024-02-17 DIAGNOSIS — G2581 Restless legs syndrome: Secondary | ICD-10-CM | POA: Diagnosis not present

## 2024-02-17 DIAGNOSIS — G4733 Obstructive sleep apnea (adult) (pediatric): Secondary | ICD-10-CM

## 2024-02-17 DIAGNOSIS — J301 Allergic rhinitis due to pollen: Secondary | ICD-10-CM

## 2024-02-17 DIAGNOSIS — Z6841 Body Mass Index (BMI) 40.0 and over, adult: Secondary | ICD-10-CM

## 2024-02-17 NOTE — Patient Instructions (Signed)
 Allergic rhinitis due to pollen, unspecified seasonality Flonase 2 spray/d during allergy time. 1 spray after that.  Saline spray as needed.  May prescribe azelastine subsequently.

## 2024-02-17 NOTE — Progress Notes (Signed)
 New Patient Pulmonology Office Visit   Subjective:  Patient ID: Antonio Hunt, male    DOB: 09/19/1980  MRN: [id number]  Referred by: Lucius Krabbe, NP  CC:  Chief Complaint  Patient presents with   Consult    Sleep- referred by Dr. Quintin Crane.  Pt's spouse has witnessed snoring and apneic events.     HPI Antonio Hunt is a 43 y.o. male with hypertension, anxiety with signs and symptoms of sleep apnea presents for evaluation for OSA  Mouth breather: can be a mouth breather.  Preferred sleeping position: side.   Sleep related Symptoms:  Snoring- y Witnessed apnea- y Gasping/choking- n morning HA/dry mouth- y/n tired on awakening, excessive daytime sleepiness- y Restless legs- started recently. Usually at night.  Hypnogogic hallucination- no sleep paralysis- n sleep walking- used to do as child.  sleep talking- n night terrors/mares- n dream enactment- tried to choke a pillow 12 years ago. But no more incidence since then.   Sleep routine: Before bed: on cellphone. Usually thinking of thinkng of different things while sleeping.  -Bed: 3a, take 1-2 hrs.  -Nocturnal awakenings: 3-5 -Wake: 9.30a -Napping:no  Weight: watching cal and exercising. Walking 1-2 miles/d. Had ankle trauma and then exercising went down. Had lost weight but then gained weight again. Wife will be seeing weight management and he hopes this will trickle down to him as well.   Social Hist/Habits:  -Caffeine: 1-2 cup/d, last cup 4 pm.   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   PRIOR TESTS and IMAGING: PSG/HSAT: none.       02/17/2024    2:00 PM 10/25/2022    4:28 PM  Results of the Epworth flowsheet  Sitting and reading 0 1  Watching TV 0 0  Sitting, inactive in a public place (e.g. a theatre or a meeting) 0 0  As a passenger in a car for an hour without a break 2 2  Lying down to rest in the afternoon when circumstances permit 1 0  Sitting and  talking to someone 0 0  Sitting quietly after a lunch without alcohol 1 0  In a car, while stopped for a few minutes in traffic 1 0  Total score 5 3    Allergies: Patient has no known allergies.  Current Outpatient Medications:    ibuprofen (ADVIL,MOTRIN) 200 MG tablet, Take 400 mg by mouth every 6 (six) hours as needed for moderate pain., Disp: , Rfl:    losartan  (COZAAR ) 25 MG tablet, Take 1 tablet (25 mg total) by mouth 2 (two) times daily., Disp: 180 tablet, Rfl: 0   rosuvastatin  (CRESTOR ) 10 MG tablet, Take 1 tablet (10 mg total) by mouth daily., Disp: 90 tablet, Rfl: 0   propranolol  (INDERAL ) 10 MG tablet, Take 1-2 tablets (10-20 mg total) by mouth 3 (three) times daily as needed (For Anxiety or panic attack). (Patient not taking: Reported on 02/17/2024), Disp: 60 tablet, Rfl: 1 Past Medical History:  Diagnosis Date   Hypertension    Past Surgical History:  Procedure Laterality Date   EYE SURGERY     No family history on file. Social History   Socioeconomic History   Marital status: Married    Spouse name: Not on file   Number of children: Not on file   Years of education: Not on file   Highest education level: Not on file  Occupational History   Not on file  Tobacco Use   Smoking status: Never  Smokeless tobacco: Never  Substance and Sexual Activity   Alcohol use: Yes    Comment: maybe twice a year   Drug use: No   Sexual activity: Not on file  Other Topics Concern   Not on file  Social History Narrative   Not on file   Social Drivers of Health   Financial Resource Strain: Not on file  Food Insecurity: Not on file  Transportation Needs: Not on file  Physical Activity: Not on file  Stress: Not on file  Social Connections: Not on file  Intimate Partner Violence: Not on file       Objective:  BP 124/68   Pulse 91   Ht 5' 6 (1.676 m) Comment: per pt  Wt (!) 341 lb (154.7 kg)   SpO2 97% Comment: on RA  BMI 55.04 kg/m  BMI Readings from Last 3  Encounters:  02/17/24 55.04 kg/m  12/19/23 54.29 kg/m  10/25/22 51.94 kg/m    Physical Exam: CONSTITUTIONAL: NAD, well-appearing NASAL/OROPHARYNX:  Normal mucosa. No septal deviation. No hypertrophy of inferior turbinates. Modified Mallampati score 2. Tonsillar grade 1.  CV: RRR s1s2 nl, no murmurs  RESP: Clear to auscultation, normal respiratory effort   NEURO: CN II/XII grossly intact PSYCH: Alert & oriented x 3, Euthymic, appropriate affect  Diagnostic Review:  Last metabolic panel Lab Results  Component Value Date   GLUCOSE 122 (H) 05/15/2022   NA 138 05/15/2022   K 4.0 05/15/2022   CL 103 05/15/2022   CO2 25 05/15/2022   BUN 10 05/15/2022   CREATININE 0.69 05/15/2022   GFRNONAA >60 05/15/2022   CALCIUM  9.3 05/15/2022   ANIONGAP 10 05/15/2022         Assessment & Plan:   Assessment & Plan OSA (obstructive sleep apnea) I discussed with the patient the pathophysiology of obstructive sleep apnea, its association with weight, and its negative effects on hypertension, diabetes, mental health, A-fib, stroke if left untreated.  I briefly discussed the treatment options for obstructive sleep apnea  - if positive will start CPAP, will do hybrid mask.  - side sleep.   Orders:   Home sleep test; Future  Morbid obesity (HCC) Will be working on healthy diet and exercise.  Will consider GLP1 antagonist in future after HST.      Restless leg syndrome Discussed pathophysiology of RLS.  May treat w iron infusion based on iron levels. Will need to ask patient prior to ordering.  Orders:   Iron, TIBC and Ferritin Panel; Future  Allergic rhinitis due to pollen, unspecified seasonality Flonase 2 spray/d during allergy time. 1 spray after that.  Saline spray as needed.  May prescribe azelastine subsequently.      He was counselled about not driving while drowsy which is common side effect of sleep related disorders.   Return for 2 months after sleep study.   I  personally spent a total of 30 minutes in the care of the patient today including preparing to see the patient, getting/reviewing separately obtained history, performing a medically appropriate exam/evaluation, counseling and educating, placing orders, documenting clinical information in the EHR, independently interpreting results, and communicating results.   Gershom Brobeck, MD

## 2024-02-18 ENCOUNTER — Ambulatory Visit: Payer: Self-pay

## 2024-02-18 LAB — IRON,TIBC AND FERRITIN PANEL
%SAT: 25 % (ref 20–48)
Ferritin: 118 ng/mL (ref 38–380)
Iron: 63 ug/dL (ref 50–180)
TIBC: 249 ug/dL — ABNORMAL LOW (ref 250–425)

## 2024-03-11 ENCOUNTER — Encounter

## 2024-03-11 DIAGNOSIS — G4733 Obstructive sleep apnea (adult) (pediatric): Secondary | ICD-10-CM

## 2024-03-26 ENCOUNTER — Other Ambulatory Visit: Payer: Self-pay | Admitting: Family

## 2024-03-26 DIAGNOSIS — I10 Essential (primary) hypertension: Secondary | ICD-10-CM

## 2024-04-02 ENCOUNTER — Other Ambulatory Visit (HOSPITAL_BASED_OUTPATIENT_CLINIC_OR_DEPARTMENT_OTHER): Payer: Self-pay | Admitting: Family Medicine

## 2024-04-02 ENCOUNTER — Telehealth: Payer: Self-pay | Admitting: Pulmonary Disease

## 2024-04-02 DIAGNOSIS — G4733 Obstructive sleep apnea (adult) (pediatric): Secondary | ICD-10-CM | POA: Diagnosis not present

## 2024-04-02 DIAGNOSIS — I10 Essential (primary) hypertension: Secondary | ICD-10-CM

## 2024-04-02 NOTE — Telephone Encounter (Signed)
 Copied from CRM #[id number]. Topic: Clinical - Medication Refill >> Apr 02, 2024 11:40 AM Joesph B wrote: Medication:  losartan  (COZAAR ) 25 MG tablet. Patient has a appt scheduled/ he is worried his bp with spike if he does not get a refill.   Has the patient contacted their pharmacy? Yes (Agent: If no, request that the patient contact the pharmacy for the refill. If patient does not wish to contact the pharmacy document the reason why and proceed with request.) (Agent: If yes, when and what did the pharmacy advise?)  This is the patient's preferred pharmacy:  [id number] - [place], [place] - [address] AT [facility] [address] [place] [address] Phone: [phone] Fax: [phone]  Is this the correct pharmacy for this prescription? Yes If no, delete pharmacy and type the correct one.   Has the prescription been filled recently? Yes  Is the patient out of the medication? Yes  Has the patient been seen for an appointment in the last year OR does the patient have an upcoming appointment? Yes  Can we respond through MyChart? Yes  Agent: Please be advised that Rx refills may take up to 3 business days. We ask that you follow-up with your pharmacy.

## 2024-04-02 NOTE — Telephone Encounter (Signed)
 HST showed severe  OSA with AHI 38/ hr & low sat 79% Suggest autoCPAP  5-20 cm, mask of choice Schedule CPAP titration study OV with dr pawar in 6-8 wks

## 2024-04-03 MED ORDER — LOSARTAN POTASSIUM 25 MG PO TABS: 25.0000 mg | ORAL_TABLET | Freq: Two times a day (BID) | ORAL | 0 refills | Status: DC

## 2024-04-03 NOTE — Addendum Note (Signed)
 Addended by: DE CUBA, RJ J on: 04/03/2024 08:59 AM   Modules accepted: Orders

## 2024-04-03 NOTE — Telephone Encounter (Signed)
 Order placed,patient aware.NFN

## 2024-04-03 NOTE — Addendum Note (Signed)
 Addended by: MELVENIA WILFORD SAUNDERS on: 04/03/2024 02:18 PM   Modules accepted: Orders

## 2024-04-09 ENCOUNTER — Telehealth (INDEPENDENT_AMBULATORY_CARE_PROVIDER_SITE_OTHER): Admitting: Family Medicine

## 2024-04-09 DIAGNOSIS — Z0000 Encounter for general adult medical examination without abnormal findings: Secondary | ICD-10-CM | POA: Diagnosis not present

## 2024-04-09 DIAGNOSIS — I10 Essential (primary) hypertension: Secondary | ICD-10-CM

## 2024-04-09 DIAGNOSIS — E782 Mixed hyperlipidemia: Secondary | ICD-10-CM | POA: Diagnosis not present

## 2024-04-09 DIAGNOSIS — G4733 Obstructive sleep apnea (adult) (pediatric): Secondary | ICD-10-CM | POA: Diagnosis not present

## 2024-04-09 MED ORDER — LOSARTAN POTASSIUM 50 MG PO TABS: 50.0000 mg | ORAL_TABLET | Freq: Every day | ORAL | 1 refills | Status: AC

## 2024-04-09 MED ORDER — ROSUVASTATIN CALCIUM 10 MG PO TABS: 10.0000 mg | ORAL_TABLET | Freq: Every day | ORAL | 1 refills | Status: AC

## 2024-04-09 MED ORDER — ZEPBOUND 2.5 MG/0.5ML ~~LOC~~ SOAJ: 2.5000 mg | SUBCUTANEOUS | 1 refills | Status: AC

## 2024-04-09 MED ORDER — ONDANSETRON 4 MG PO TBDP: 4.0000 mg | ORAL_TABLET | Freq: Three times a day (TID) | ORAL | 1 refills | Status: AC | PRN

## 2024-04-09 NOTE — Assessment & Plan Note (Signed)
 Was seen at different [facility] office recently for acute visit and did have blood pressure medication refilled at that time.  Blood pressure was notably elevated at that visit as he had been without medication.  He was able to resume medication, did have office visit with pulmonology and blood pressure improved with being on medication regularly then. He is currently prescribed 25 mg losartan  twice daily.  We discussed that we could simplify regimen by taking 50 mg once daily, he is interested in this, prescription sent to pharmacy Recommend intermittent monitoring of blood pressure home, DASH diet.

## 2024-04-09 NOTE — Progress Notes (Signed)
 "  Virtual Visit   I connected with  Antonio Hunt  on 04/09/24 by telehealth and verified that I am speaking with the correct person using two identifiers. Visit completed via video.  I discussed the limitations, risks, security and privacy concerns of performing an evaluation and management service by telephone, including the higher likelihood of inaccurate diagnosis and treatment, and the availability of in person appointments.  We also discussed the likely need of an additional face to face encounter for complete and high quality delivery of care.  I also discussed with the patient that there may be a patient responsible charge related to this service. The patient expressed understanding and wishes to proceed.  Provider location is in medical facility. Patient location is at their home, different from provider location. People involved in care of the patient during this telehealth encounter were myself, my nurse/medical assistant, and my front office/scheduling team member.  Review of Systems: No fevers, chills, night sweats, weight loss, chest pain, or shortness of breath.   Objective Findings:    General: Speaking full sentences, no audible heavy breathing.  Sounds alert and appropriately interactive.    Independent interpretation of tests performed by another provider:   None.  Brief History, Exam, Impression, and Recommendations:    Patient is overdue for follow-up, has not been seen here in the office since August 2024.  Hyperlipidemia Currently continues with rosuvastatin .  Has had prior evaluation with vascular surgeon due to incidental finding of carotid artery plaque.  Further evaluation indicated that stenosis was less than 50%. Tolerating medication well, can continue with current dose of rosuvastatin .  We will monitor cholesterol panel intermittently moving forward  Obstructive sleep apnea Diagnosed recently with OSA following evaluation with pulmonology.  With  diagnosis, patient has increased motivation when it comes to working towards weight loss and trying to address underlying sleep apnea. Recommend continuing follow-up with sleep medicine specialist for management of OSA.  We also discussed additional medications which can help with OSA, specifically discussed Zepbound .  He would be interested in proceeding with GLP-1 medication, prescription sent to pharmacy.  Primary hypertension Was seen at different [facility] office recently for acute visit and did have blood pressure medication refilled at that time.  Blood pressure was notably elevated at that visit as he had been without medication.  He was able to resume medication, did have office visit with pulmonology and blood pressure improved with being on medication regularly then. He is currently prescribed 25 mg losartan  twice daily.  We discussed that we could simplify regimen by taking 50 mg once daily, he is interested in this, prescription sent to pharmacy Recommend intermittent monitoring of blood pressure home, DASH diet.  Severe obesity (BMI >= 40) (HCC) Long discussion today reviewing weight loss medications including injectable options including GLP-1 receptor agonist, oral agents including Contrave, orlistat, phentermine.  We discussed potential risk, benefits, cost associated with these various medications as well as the possibility of insurance coverage or lack thereof.  We discussed typical dosing regimen for both injectable and oral medications, proper administration of each.  We discussed potential outcomes with each. After discussion of potential risks and adverse reactions with these medications and potential benefits of each, patient would like to proceed with injectable GLP-1 receptor agonist if possible.  Prescription sent to pharmacy on file, if medication is cost prohibitive for patient, he will let us  know and we can look to send an alternative option.  Will plan for close follow-up to  monitor  response to whichever medication patient is able to initiate. Additional consideration is for referral to healthy weight and wellness clinic Patient also with underlying OSA which is another indication for Zepbound .  We will arrange for physical with labs to be completed 1 week prior to be done in about 2 months.  I discussed the above assessment and treatment plan with the patient. The patient was provided an opportunity to ask questions and all were answered. The patient agreed with the plan and demonstrated an understanding of the instructions.  The patient was advised to call back or seek an in-person evaluation if the symptoms worsen or if the condition fails to improve as anticipated.  I provided 16 minutes of face to face and non-face-to-face time during this encounter date, time was needed to gather information, review chart, records, communicate/coordinate with staff remotely, as well as complete documentation.   ___________________________________________ Nikolette Reindl de [place], MD, ABFM, CAQSM [facility] "

## 2024-04-09 NOTE — Assessment & Plan Note (Signed)
 Long discussion today reviewing weight loss medications including injectable options including GLP-1 receptor agonist, oral agents including Contrave, orlistat, phentermine.  We discussed potential risk, benefits, cost associated with these various medications as well as the possibility of insurance coverage or lack thereof.  We discussed typical dosing regimen for both injectable and oral medications, proper administration of each.  We discussed potential outcomes with each. After discussion of potential risks and adverse reactions with these medications and potential benefits of each, patient would like to proceed with injectable GLP-1 receptor agonist if possible.  Prescription sent to pharmacy on file, if medication is cost prohibitive for patient, he will let us  know and we can look to send an alternative option.  Will plan for close follow-up to monitor response to whichever medication patient is able to initiate. Additional consideration is for referral to healthy weight and wellness clinic Patient also with underlying OSA which is another indication for Zepbound .

## 2024-04-09 NOTE — Assessment & Plan Note (Signed)
 Diagnosed recently with OSA following evaluation with pulmonology.  With diagnosis, patient has increased motivation when it comes to working towards weight loss and trying to address underlying sleep apnea. Recommend continuing follow-up with sleep medicine specialist for management of OSA.  We also discussed additional medications which can help with OSA, specifically discussed Zepbound .  He would be interested in proceeding with GLP-1 medication, prescription sent to pharmacy.

## 2024-04-09 NOTE — Assessment & Plan Note (Signed)
 Currently continues with rosuvastatin .  Has had prior evaluation with vascular surgeon due to incidental finding of carotid artery plaque.  Further evaluation indicated that stenosis was less than 50%. Tolerating medication well, can continue with current dose of rosuvastatin .  We will monitor cholesterol panel intermittently moving forward

## 2024-04-16 ENCOUNTER — Telehealth (HOSPITAL_BASED_OUTPATIENT_CLINIC_OR_DEPARTMENT_OTHER): Payer: Self-pay

## 2024-04-16 NOTE — Telephone Encounter (Signed)
 CMN received for CPAP supplies sent to [facility] signed by provider and faxed confirmation received

## 2024-04-23 ENCOUNTER — Ambulatory Visit (HOSPITAL_BASED_OUTPATIENT_CLINIC_OR_DEPARTMENT_OTHER): Admitting: Family Medicine

## 2024-04-28 ENCOUNTER — Ambulatory Visit

## 2024-06-04 ENCOUNTER — Ambulatory Visit (HOSPITAL_BASED_OUTPATIENT_CLINIC_OR_DEPARTMENT_OTHER): Admitting: Pulmonary Disease
